# Patient Record
Sex: Male | Born: 1990 | Race: Black or African American | Hispanic: No | Marital: Single | Smoking: Never smoker
Health system: Southern US, Community
[De-identification: ages and names within clinical notes are randomized; demographics above are authoritative.]

---
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
## 2012-02-26 ENCOUNTER — Encounter (HOSPITAL_COMMUNITY): Payer: Self-pay | Admitting: *Deleted

## 2012-02-26 ENCOUNTER — Emergency Department (HOSPITAL_COMMUNITY): Payer: Self-pay

## 2012-02-26 ENCOUNTER — Emergency Department (HOSPITAL_COMMUNITY)
Admission: EM | Admit: 2012-02-26 | Discharge: 2012-02-26 | Disposition: A | Discharge status: Home / Self Care | Payer: Self-pay | Attending: Emergency Medicine | Admitting: Emergency Medicine

## 2012-02-26 DIAGNOSIS — S43006A Unspecified dislocation of unspecified shoulder joint, initial encounter: Secondary | ICD-10-CM | POA: Insufficient documentation

## 2012-02-26 DIAGNOSIS — X503XXA Overexertion from repetitive movements, initial encounter: Secondary | ICD-10-CM | POA: Insufficient documentation

## 2012-02-26 DIAGNOSIS — Y9371 Activity, boxing: Secondary | ICD-10-CM | POA: Insufficient documentation

## 2012-02-26 DIAGNOSIS — S43004A Unspecified dislocation of right shoulder joint, initial encounter: Secondary | ICD-10-CM

## 2012-02-26 DIAGNOSIS — Y9239 Other specified sports and athletic area as the place of occurrence of the external cause: Secondary | ICD-10-CM | POA: Insufficient documentation

## 2012-02-26 MED ORDER — MIDAZOLAM HCL 2 MG/2ML IJ SOLN
2.0000 mg | Freq: Once | INTRAMUSCULAR | Status: DC
  Filled 2012-02-26: qty 2

## 2012-02-26 MED ORDER — PROPOFOL 10 MG/ML IV BOLUS
INTRAVENOUS | Status: AC | PRN
  Administered 2012-02-26: 5 mg via INTRAVENOUS

## 2012-02-26 MED ORDER — TRAMADOL HCL 50 MG PO TABS: 50.0000 mg | ORAL_TABLET | Freq: Four times a day (QID) | ORAL | Status: DC | PRN

## 2012-02-26 MED ORDER — FENTANYL CITRATE 0.05 MG/ML IJ SOLN
100.0000 ug | Freq: Once | INTRAMUSCULAR | Status: AC
  Administered 2012-02-26: 100 ug via INTRAVENOUS
  Filled 2012-02-26: qty 2

## 2012-02-26 MED ORDER — PROPOFOL 10 MG/ML IV BOLUS: 1.0000 mg/kg | Freq: Once | INTRAVENOUS | Status: DC

## 2012-02-26 MED ORDER — PROPOFOL 10 MG/ML IV BOLUS
INTRAVENOUS | Status: AC
  Filled 2012-02-26: qty 20

## 2012-02-26 MED ORDER — MIDAZOLAM HCL 2 MG/2ML IJ SOLN
INTRAMUSCULAR | Status: AC | PRN
  Administered 2012-02-26: 2 mg via INTRAVENOUS

## 2012-02-26 MED ORDER — NAPROXEN 500 MG PO TABS: 500.0000 mg | ORAL_TABLET | Freq: Two times a day (BID) | ORAL | Status: DC

## 2012-02-26 NOTE — ED Notes (Signed)
Denies pain, nausea, numbness or tingling. CMS intact. Cap refill <2sec. arousable to voice, coughs on command, follows commands, MAEx4, resting with eyes closed.VSS/WNL.

## 2012-02-26 NOTE — ED Provider Notes (Signed)
History     CSN: [id number]  Arrival date & time 02/26/12  0116   None     Chief Complaint  Patient presents with  . Shoulder Pain    (Consider location/radiation/quality/duration/timing/severity/associated sxs/prior treatment) HPI Comments: 21 year old male with a history of right shoulder injury which occurred approximately 20 minutes prior to arrival. It was acute in onset, persistent, moderate to severe, worse with range of motion of the right shoulder and not associated with numbness tingling or weakness of the right hand. He states that he has never dislocated his right shoulder before, this happened while he was "boxing".  Patient is a 21 y.o. male presenting with shoulder pain. The history is provided by the patient.  Shoulder Pain    History reviewed. No pertinent past medical history.  History reviewed. No pertinent past surgical history.  No family history on file.  History  Substance Use Topics  . Smoking status: Never Smoker   . Smokeless tobacco: Not on file  . Alcohol Use: No      Review of Systems  Musculoskeletal: Positive for joint swelling.  Skin: Negative for rash.  Neurological: Negative for weakness and numbness.    Allergies  Review of patient's allergies indicates no known allergies.  Home Medications  No current outpatient prescriptions on file.  BP 118/81  Pulse 101  Temp 98.1 F (36.7 C) (Oral)  Resp 20  SpO2 100%  Physical Exam  Nursing note and vitals reviewed. Constitutional: He appears well-developed. No distress.       Uncomfortable appearing  HENT:       Oropharynx is clear and moist  Eyes: Conjunctivae normal are normal. No scleral icterus.  Neck: Normal range of motion. Neck supple.  Cardiovascular: Normal rate, regular rhythm, normal heart sounds and intact distal pulses.   Pulmonary/Chest: Effort normal and breath sounds normal.  Musculoskeletal: He exhibits tenderness. He exhibits no edema.       Right shoulder  deformity  Neurological: He is alert. Coordination normal.       Normal sensation and grip of the right upper extremity  Skin: Skin is warm and dry. No rash noted.    ED Course  Procedures (including critical care time)  Labs Reviewed - No data to display No results found.   No diagnosis found.    MDM  Clinically the patient appears to dislocated shoulder, I was unable to relocate the shoulder on arrival without sedating medicines, x-ray ordered, fentanyl given, procedural sedation discussed with patient, verbal and written consent obtained.   Procedure Note:      Dislocation Reduction  Date, Time: February 26 2012  2:15 AM  Indication: Dislocation of  the right shoulder(s) The patient has expressed understanding of the procedure being preformed Risks Benefits and alternatives of the procedure were give to the patient Written Consent obtained from patient Imaging results available showing:  Right anterior shoulder dislocation Site Marked : Yes  Time out called immediately prior to procedure Patient was placed in semifowler position Medicines used 2 mg Versed, 50 mg propofol, 100 mcg fentanyl Method used traction, countertraction Immobilization applied post reduction Post reduction radiographs successful reduction Patient tolerated procedure without any immediate complications. Approximate time of physician involvement in procedure 20 minutes Well tolerated by patient.   Post reduction films show interval reduction of the shoulder, the patient appears stable for discharge and he is neurovascularly intact after reduction.       Vida Roller, MD 02/26/12 [phone]

## 2012-02-26 NOTE — ED Notes (Signed)
Sleeping, awake, interactive, coughs on command, swallows water w/o difficulty, steward Scale of 6, VSS. Pending arrival of xray for post reduction films.

## 2012-02-26 NOTE — ED Notes (Signed)
Pt. Alert and oriented x4. 

## 2012-02-26 NOTE — ED Notes (Signed)
Pt was wrestling with a friend and feels like he dislocated R shoulder.  No numbness to R arm, but R shoulder appears deformed.

## 2012-02-26 NOTE — ED Notes (Signed)
To x-ray via stretcher.

## 2012-02-26 NOTE — ED Notes (Signed)
Vital signs stable. 

## 2012-02-26 NOTE — ED Notes (Signed)
Pt into room via w/c, EDP into room, xray here for pt. Pt alert, NAD, calm, interactive, guarding R shoulder, deformity obvious.

## 2012-02-26 NOTE — ED Notes (Signed)
R shoulder reduced by Dr. Hyacinth Meeker, 1st attempt using sheet for counter pressure, w/o difficulty or complications.

## 2012-02-26 NOTE — Progress Notes (Signed)
Orthopedic Tech Progress Note Patient Details:  Adam Salazar 03-02-1991 [id number]  Ortho Devices Type of Ortho Device: Sling immobilizer   Haskell Flirt 02/26/2012, 2:27 AM

## 2012-02-26 NOTE — ED Notes (Signed)
Patient denies pain and is resting comfortably.  

## 2012-02-26 NOTE — ED Notes (Signed)
Back from xray, no changes.  ?

## 2012-04-09 ENCOUNTER — Encounter (HOSPITAL_COMMUNITY): Payer: Self-pay | Admitting: *Deleted

## 2012-04-09 ENCOUNTER — Emergency Department (HOSPITAL_COMMUNITY)
Admission: EM | Admit: 2012-04-09 | Discharge: 2012-04-09 | Disposition: A | Discharge status: Home / Self Care | Payer: Self-pay | Attending: Emergency Medicine | Admitting: Emergency Medicine

## 2012-04-09 ENCOUNTER — Emergency Department (HOSPITAL_COMMUNITY): Payer: Self-pay

## 2012-04-09 DIAGNOSIS — X58XXXA Exposure to other specified factors, initial encounter: Secondary | ICD-10-CM | POA: Insufficient documentation

## 2012-04-09 DIAGNOSIS — Y9389 Activity, other specified: Secondary | ICD-10-CM | POA: Insufficient documentation

## 2012-04-09 DIAGNOSIS — IMO0002 Reserved for concepts with insufficient information to code with codable children: Secondary | ICD-10-CM | POA: Insufficient documentation

## 2012-04-09 DIAGNOSIS — Y929 Unspecified place or not applicable: Secondary | ICD-10-CM | POA: Insufficient documentation

## 2012-04-09 DIAGNOSIS — S43401A Unspecified sprain of right shoulder joint, initial encounter: Secondary | ICD-10-CM

## 2012-04-09 MED ORDER — IBUPROFEN 800 MG PO TABS: 800.0000 mg | ORAL_TABLET | Freq: Three times a day (TID) | ORAL | Status: DC

## 2012-04-09 NOTE — ED Provider Notes (Signed)
History     CSN: [id number]  Arrival date & time 04/09/12  2147   None     Chief Complaint  Patient presents with  . Shoulder Injury    (Consider location/radiation/quality/duration/timing/severity/associated sxs/prior treatment) HPI History provided by pt.   Pt w/ h/o right shoulder dislocation presents w/ R shoulder pain after his cousin landed on it while rough-housing on floor.  Aggravated by ROM and associated w/ R hand edema.  Denies neck pain and extremity paresthesias.   History reviewed. No pertinent past medical history.  History reviewed. No pertinent past surgical history.  History reviewed. No pertinent family history.  History  Substance Use Topics  . Smoking status: Never Smoker   . Smokeless tobacco: Not on file  . Alcohol Use: No      Review of Systems  All other systems reviewed and are negative.    Allergies  Review of patient's allergies indicates no known allergies.  Home Medications   Current Outpatient Rx  Name  Route  Sig  Dispense  Refill  . IBUPROFEN 800 MG PO TABS   Oral   Take 1 tablet (800 mg total) by mouth 3 (three) times daily.   12 tablet   0     BP 144/96  Pulse 71  Temp 98 F (36.7 C) (Oral)  Resp 20  SpO2 100%  Physical Exam  Nursing note and vitals reviewed. Constitutional: He is oriented to person, place, and time. He appears well-developed and well-nourished. No distress.  HENT:  Head: Normocephalic and atraumatic.  Eyes:       Normal appearance  Neck: Normal range of motion.  Pulmonary/Chest: Effort normal.  Musculoskeletal: Normal range of motion.       Cervical spine non-tender.  R shoulder w/out deformity or tenderness.  Pain w/ passive abduction and flexion >90deg.  Nml elbow and wrist.  No obvious R hand edema.  2+ radial pulse and distal sensation intact.      Neurological: He is alert and oriented to person, place, and time.  Psychiatric: He has a normal mood and affect. His behavior is normal.     ED Course  Procedures (including critical care time)  Labs Reviewed - No data to display Dg Shoulder Right  04/09/2012  *RADIOLOGY REPORT*  Clinical Data: Right shoulder pain post injury.  RIGHT SHOULDER - 2+ VIEW  Comparison: 02/26/2012  Findings: Bones, joint spaces and soft tissues are within normal. There is no fracture or dislocation.  IMPRESSION: No acute findings.   Original Report Authenticated By: Elberta Fortis, M.D.      1. Sprain of right shoulder       MDM  21yo M w/ h/o R shoulder dislocation presents w/ R shoulder injury.  No NV deficits on exam and xray neg for fx/dislocation.  Results discussed w/ pt.  Ortho tech placed in shoulder sling and pt d/c'd home w/ 800mg  ibuprofen.  Recommended icing and avoidance of aggravating activities. 10:37 PM        Otilio Miu, PA-C 04/09/12 2237

## 2012-04-09 NOTE — ED Notes (Signed)
Pt in stating his right shoulder is dislocated, pt in no distress and moving right arm without difficulty but c/o increased pain, states history of dislocation, pain has improved.

## 2012-04-09 NOTE — Progress Notes (Signed)
Orthopedic Tech Progress Note Patient Details:  Adam Salazar [birth year] [id number]  Ortho Devices Type of Ortho Device: Shoulder immobilizer Ortho Device/Splint Location: right UE Ortho Device/Splint Interventions: Application   Adam Salazar T 04/09/2012, 10:53 PM

## 2012-04-10 NOTE — ED Provider Notes (Signed)
Medical screening examination/treatment/procedure(s) were performed by non-physician practitioner and as supervising physician I was immediately available for consultation/collaboration.  Juliet Rude. Rubin Payor, MD 04/10/12 1478

## 2014-05-22 ENCOUNTER — Emergency Department (INDEPENDENT_AMBULATORY_CARE_PROVIDER_SITE_OTHER)
Admission: EM | Admit: 2014-05-22 | Discharge: 2014-05-22 | Disposition: A | Discharge status: Home / Self Care | Payer: Self-pay | Source: Home / Self Care | Attending: Family Medicine | Admitting: Family Medicine

## 2014-05-22 ENCOUNTER — Emergency Department (INDEPENDENT_AMBULATORY_CARE_PROVIDER_SITE_OTHER): Payer: Self-pay

## 2014-05-22 ENCOUNTER — Encounter (HOSPITAL_COMMUNITY): Payer: Self-pay | Admitting: *Deleted

## 2014-05-22 DIAGNOSIS — S4291XA Fracture of right shoulder girdle, part unspecified, initial encounter for closed fracture: Secondary | ICD-10-CM

## 2014-05-22 MED ORDER — IBUPROFEN 800 MG PO TABS: 800.0000 mg | ORAL_TABLET | Freq: Three times a day (TID) | ORAL | Status: DC

## 2014-05-22 MED ORDER — HYDROCODONE-ACETAMINOPHEN 5-325 MG PO TABS: 1.0000 | ORAL_TABLET | Freq: Four times a day (QID) | ORAL | Status: DC | PRN

## 2014-05-22 MED ORDER — OXYCODONE-ACETAMINOPHEN 5-325 MG PO TABS: 2.0000 | ORAL_TABLET | Freq: Four times a day (QID) | ORAL | Status: DC | PRN

## 2014-05-22 NOTE — Discharge Instructions (Signed)
Fracture A fracture is a break in a bone, due to a force on the bone that is greater than the bone's strength can handle. There are many types of fractures, including:  Complete fracture: The break passes completely through the bone.  Displaced: The ends of the bone fragments are not properly aligned.  Non-displaced: The ends of the bone fragments are in proper alignment.  Incomplete fracture (greenstick): The break does not pass completely through the bone. Incomplete fractures may or may not be angular (angulated).  Open fracture (compound): Part of the broken bone pokes through the skin. Open fractures have a high risk for infection.  Closed fracture: The fracture has not broken through the skin.  Comminuted fracture: The bone is broken into more than two pieces.  Compression fracture: The break occurs from extreme pressure on the bone (includes crushing injury).  Impacted fracture: The broken bone ends have been driven into each other.  Avulsion fracture: A ligament or tendon pulls a small piece of bone off from the main bony segment.  Pathologic fracture: A fracture due to the bone being made weak by a disease (osteoporosis or tumors).  Stress fracture: A fracture caused by intense exercise or repetitive and prolonged pressure that makes the bone weak. SYMPTOMS   Pain, tenderness, bleeding, bruising, and swelling at the fracture site.  Weakness and inability to bear weight on the injured extremity.  Paleness and deformity (sometimes).  Loss of pulse, numbness, tingling, or paralysis below the fracture site (usually a limb); these are emergencies. CAUSES  Bone being subjected to a force greater than its strength. RISK INCREASES WITH:  Contact sports and falls from heights.  Previous or current bone problems (osteoporosis or tumors).  Poor balance.  Poor strength and flexibility. PREVENTION   Warm up and stretch properly before activity.  Maintain physical  fitness:  Cardiovascular fitness.  Muscle strength.  Flexibility and endurance.  Wear proper protective equipment.  Use proper exercise technique. RELATED COMPLICATIONS   Bone fails to heal (nonunion).  Bone heals in a poor position (malunion).  Low blood volume (hypovolemic), shock due to blood loss.  Clump of fat cells travels through the blood (fat embolus) from the injury site to the lungs or brain (more common with thigh fractures).  Obstruction of nearby arteries. TREATMENT  Treatment first requires realigning of the bones (reduction) by a medically trained person, if the fracture is displaced. After realignment if the fracture is completed, or for non-displaced fractures, ice and medicine are used to reduce pain and inflammation. The bone and adjacent joints are then restrained with a splint, cast, or brace to allow the bones to heal without moving. Surgery is sometimes needed, to reposition the bones and hold the position with rods, pins, plates, or screws. Restraint for long periods of time may result in muscle and joint weakness or build up of fluid in tissues (edema). For this reason, physical therapy is often needed to regain strength and full range of motion. Recovery is complete when there is no bone motion at the fracture site and x-rays (radiographs) show complete healing.  MEDICATION   General anesthesia, sedation, or muscle relaxants may be needed to allow for realignment of the fracture. If pain medicine is needed, nonsteroidal anti-inflammatory medicines (aspirin and ibuprofen), or other minor pain relievers (acetaminophen), are often advised.  Do not take pain medicine for 7 days before surgery.  Stronger pain relievers may be prescribed by your caregiver. Use only as directed and only as much   as you need. SEEK MEDICAL CARE IF:   The following occur after restraint or surgery. (Report any of these signs immediately):  Swelling above or below the fracture  site.  Severe, persistent pain.  Blue or gray skin below the fracture site, especially under the nails. Numbness or loss of feeling below the fracture site. Document Released: 04/12/2005 Document Revised: 03/29/2012 Document Reviewed: 07/25/2008 [facility]ExitCare Patient Information 2015 [place], [place]. This information is not intended to replace advice given to you by your health care provider. Make sure you discuss any questions you have with your health care provider.  Shoulder Dislocation Your shoulder is made up of three bones: the collar bone (clavicle); the shoulder blade (scapula), which includes the socket (glenoid cavity); and the upper arm bone (humerus). Your shoulder joint is the place where these bones meet. Strong, fibrous tissues hold these bones together (ligaments). Muscles and strong, fibrous tissues that connect the muscles to these bones (tendons) allow your arm to move through this joint. The range of motion of your shoulder joint is more extensive than most of your other joints, and the glenoid cavity is very shallow. That is the reason that your shoulder joint is one of the most unstable joints in your body. It is far more prone to dislocation than your other joints. Shoulder dislocation is when your humerus is forced out of your shoulder joint. CAUSES Shoulder dislocation is caused by a forceful impact on your shoulder. This impact usually is from an injury, such as a sports injury or a fall. SYMPTOMS Symptoms of shoulder dislocation include:  Deformity of your shoulder.  Intense pain.  Inability to move your shoulder joint.  Numbness, weakness, or tingling around your shoulder joint (your neck or down your arm).  Bruising or swelling around your shoulder. DIAGNOSIS In order to diagnose a dislocated shoulder, your caregiver will perform a physical exam. Your caregiver also may have an X-ray exam done to see if you have any broken bones. Magnetic resonance imaging (MRI) is a  procedure that sometimes is done to help your caregiver see any damage to the soft tissues around your shoulder, particularly your rotator cuff tendons. Additionally, your caregiver also may have electromyography done to measure the electrical discharges produced in your muscles if you have signs or symptoms of nerve damage. TREATMENT A shoulder dislocation is treated by placing the humerus back in the joint (reduction). Your caregiver does this either manually (closed reduction), by moving your humerus back into the joint through manipulation, or through surgery (open reduction). When your humerus is back in place, severe pain should improve almost immediately. You also may need to have surgery if you have a weak shoulder joint or ligaments, and you have recurring shoulder dislocations, despite rehabilitation. In rare cases, surgery is necessary if your nerves or blood vessels are damaged during the dislocation. After your reduction, your arm will be placed in a shoulder immobilizer or sling to keep it from moving. Your caregiver will have you wear your shoulder immobilizer or sling for 3 days to 3 weeks, depending on how serious your dislocation is. When your shoulder immobilizer or sling is removed, your caregiver may prescribe physical therapy to help improve the range of motion in your shoulder joint. HOME CARE INSTRUCTIONS  The following measures can help to reduce pain and speed up the healing process:  Rest your injured joint. Do not move it. Avoid activities similar to the one that caused your injury.  Apply ice to your injured joint  for the first day or two after your reduction or as directed by your caregiver. Applying ice helps to reduce inflammation and pain.  Put ice in a plastic bag.  Place a towel between your skin and the bag.  Leave the ice on for 15-20 minutes at a time, every 2 hours while you are awake.  Exercise your hand by squeezing a soft ball. This helps to eliminate  stiffness and swelling in your hand and wrist.  Take over-the-counter or prescription medicine for pain or discomfort as told by your caregiver. SEEK IMMEDIATE MEDICAL CARE IF:   Your shoulder immobilizer or sling becomes damaged.  Your pain becomes worse rather than better.  You lose feeling in your arm or hand, or they become white and cold. MAKE SURE YOU:   Understand these instructions.  Will watch your condition.  Will get help right away if you are not doing well or get worse. Document Released: 01/05/2001 Document Revised: 08/27/2013 Document Reviewed: 01/31/2011 [facility] Patient Information 2015 [place], [place]. This information is not intended to replace advice given to you by your health care provider. Make sure you discuss any questions you have with your health care provider.  Shoulder Fracture You have a fractured humerus (bone in the upper arm) at the shoulder just below the ball of the shoulder joint. Most of the time the bones of a broken shoulder are in an acceptable position. Usually the injury can be treated with a shoulder immobilizer or sling and swath bandage. These devices support the arm and prevent any shoulder movement. If the bones are not in a good position, then surgery is sometimes needed. Shoulder fractures usually cause swelling, pain, and discoloration around the upper arm initially. They heal in 8-12 weeks with proper treatment. Rest in bed or a reclining chair as long as your shoulder is very painful. Sitting up generally results in less pain at the fracture site. Do not remove your shoulder bandage until your caregiver approves. You may apply ice packs over the shoulder for 20-30 minutes every 2 hours for the next 2-3 days to reduce the pain and swelling. Use your pain medicine as prescribed.  SEEK IMMEDIATE MEDICAL CARE IF:  You develop severe shoulder pain unrelieved by rest and taking pain medicine.  You have pain, numbness, tingling, or weakness in  the hand or wrist.  You develop shortness of breath, chest pain, severe weakness, or fainting.  You have severe pain with motion of the fingers or wrist. MAKE SURE YOU:   Understand these instructions.  Will watch your condition.  Will get help right away if you are not doing well or get worse. Document Released: 05/20/2004 Document Revised: 07/05/2011 Document Reviewed: 07/31/2008 [facility] Patient Information 2015 [place], [place]. This information is not intended to replace advice given to you by your health care provider. Make sure you discuss any questions you have with your health care provider.

## 2014-05-22 NOTE — ED Provider Notes (Signed)
CSN: [id number]     Arrival date & time 05/22/14  1201 History   First MD Initiated Contact with Patient 05/22/14 1231     Chief Complaint  Patient presents with  . Shoulder Injury   (Consider location/radiation/quality/duration/timing/severity/associated sxs/prior Treatment) HPI         24 year old male presents for evaluation of shoulder dislocation. He was playing with his cousin when he fell earlier today. He thinks he dislocated his shoulder and then it went back into place spontaneously. He has pain and soreness all over the shoulder from the injury. No numbness distal to the shoulder. He has a history of 4 previous shoulder dislocations, most recently one year ago.   History reviewed. No pertinent past medical history. History reviewed. No pertinent past surgical history. History reviewed. No pertinent family history. History  Substance Use Topics  . Smoking status: Never Smoker   . Smokeless tobacco: Not on file  . Alcohol Use: No    Review of Systems  Musculoskeletal: Positive for arthralgias.  Neurological: Negative for weakness and numbness.  All other systems reviewed and are negative.   Allergies  Review of patient's allergies indicates no known allergies.  Home Medications   Prior to Admission medications   Medication Sig Start Date End Date Taking? Authorizing Provider  HYDROcodone-acetaminophen (NORCO) 5-325 MG per tablet Take 1 tablet by mouth every 6 (six) hours as needed for moderate pain. 05/22/14   Graylon GoodZachary H Agron Swiney, PA-C  ibuprofen (ADVIL,MOTRIN) 800 MG tablet Take 1 tablet (800 mg total) by mouth 3 (three) times daily. 04/09/12   Arie Sabinaatherine E Schinlever, PA-C  ibuprofen (ADVIL,MOTRIN) 800 MG tablet Take 1 tablet (800 mg total) by mouth 3 (three) times daily. 05/22/14   Adrian BlackwaterZachary H Santiel Topper, PA-C   BP 124/74 mmHg  Pulse 78  Temp(Src) 98.6 F (37 C) (Oral)  Resp 18 Physical Exam  Constitutional: He is oriented to person, place, and time. He appears  well-developed and well-nourished. No distress.  HENT:  Head: Normocephalic.  Cardiovascular:  Pulses:      Radial pulses are 2+ on the right side.  Pulmonary/Chest: Effort normal. No respiratory distress.  Musculoskeletal:       Right shoulder: He exhibits tenderness (diffuse about the shoulder\) and pain. He exhibits normal range of motion, no bony tenderness, no swelling, no effusion, no crepitus, no deformity, no spasm and normal strength.  Neurological: He is alert and oriented to person, place, and time. No sensory deficit. Coordination normal.  Skin: Skin is warm and dry. No rash noted. He is not diaphoretic.  Psychiatric: He has a normal mood and affect. Judgment normal.  Nursing note and vitals reviewed.   ED Course  Procedures (including critical care time) Labs Review Labs Reviewed - No data to display  Imaging Review Dg Shoulder Right  05/22/2014   CLINICAL DATA:  Right shoulder dislocation about an hr ago, patient reduced it himself. Multiple prior dislocations.  EXAM: RIGHT SHOULDER - 2+ VIEW  COMPARISON:  04/09/2012  FINDINGS: There is a bone fragment at the inferior aspect of the glenohumeral joint measuring approximately 2 x 5 mm. This was not evident on the 04/09/2012 examination. It may represent a loose fragment from [place] fracture. There is no dislocation at this time.  IMPRESSION: Small bone fragment inferiorly may represent a loose [place] fracture fragment. Chronicity is indeterminate but this was not evident on 04/09/2012.  No dislocation at this time.   Electronically Signed   By: Rosey Bathaniel R Mitchell M.D.  On: 05/22/2014 13:55     MDM   1. Shoulder fracture, right, closed, initial encounter    Placed in a sling immobilizer, ibuprofen and Norco for pain. I discussed with on-call orthopedist, they will see this patient next week in follow-up in clinic   Meds ordered this encounter  Medications  . HYDROcodone-acetaminophen (NORCO) 5-325 MG per tablet     Sig: Take 1 tablet by mouth every 6 (six) hours as needed for moderate pain.    Dispense:  20 tablet    Refill:  0    Order Specific Question:  Supervising Provider    Answer:  Clementeen Graham, S [id number]  . ibuprofen (ADVIL,MOTRIN) 800 MG tablet    Sig: Take 1 tablet (800 mg total) by mouth 3 (three) times daily.    Dispense:  60 tablet    Refill:  0    Order Specific Question:  Supervising Provider    Answer:  Clementeen Graham, Kathie Rhodes [[id number]]       Graylon Good, PA-C 05/22/14 1445

## 2014-05-22 NOTE — ED Notes (Signed)
Pt  Reports   He  Was  horseplaying      Today  And  His  Shoulder       May  Have  Popped  Out  And  He  Thinks  It is  Now back  In          he  Has  Had  A  History  Of  Similar    Episode  In past

## 2014-09-23 ENCOUNTER — Emergency Department (HOSPITAL_COMMUNITY): Payer: Self-pay

## 2014-09-23 ENCOUNTER — Encounter (HOSPITAL_COMMUNITY): Payer: Self-pay | Admitting: *Deleted

## 2014-09-23 ENCOUNTER — Emergency Department (HOSPITAL_COMMUNITY)
Admission: EM | Admit: 2014-09-23 | Discharge: 2014-09-23 | Disposition: A | Discharge status: Home / Self Care | Payer: Self-pay | Attending: Emergency Medicine | Admitting: Emergency Medicine

## 2014-09-23 DIAGNOSIS — Y998 Other external cause status: Secondary | ICD-10-CM | POA: Insufficient documentation

## 2014-09-23 DIAGNOSIS — X58XXXA Exposure to other specified factors, initial encounter: Secondary | ICD-10-CM | POA: Insufficient documentation

## 2014-09-23 DIAGNOSIS — Y9389 Activity, other specified: Secondary | ICD-10-CM | POA: Insufficient documentation

## 2014-09-23 DIAGNOSIS — Y9289 Other specified places as the place of occurrence of the external cause: Secondary | ICD-10-CM | POA: Insufficient documentation

## 2014-09-23 DIAGNOSIS — S43004A Unspecified dislocation of right shoulder joint, initial encounter: Secondary | ICD-10-CM | POA: Insufficient documentation

## 2014-09-23 DIAGNOSIS — Z791 Long term (current) use of non-steroidal anti-inflammatories (NSAID): Secondary | ICD-10-CM | POA: Insufficient documentation

## 2014-09-23 MED ORDER — TRAMADOL HCL 50 MG PO TABS: 50.0000 mg | ORAL_TABLET | Freq: Four times a day (QID) | ORAL | Status: DC | PRN

## 2014-09-23 MED ORDER — LIDOCAINE-EPINEPHRINE (PF) 2 %-1:200000 IJ SOLN
10.0000 mL | Freq: Once | INTRAMUSCULAR | Status: AC
  Administered 2014-09-23: 10 mL

## 2014-09-23 NOTE — ED Notes (Signed)
Pt connected to continuous BP, pulse ox and cardiac monitor. Family at bedside

## 2014-09-23 NOTE — ED Notes (Signed)
Pt in stating he thinks he dislocated his right shoulder, history of same in the past, limited movement to right arm, no obvious deformity noted, CMS intact

## 2014-09-23 NOTE — ED Notes (Signed)
X-ray at bedside

## 2014-09-23 NOTE — ED Notes (Signed)
EDP and resident at bedside attempting to reduce R shoulder. Pt tolerating without difficulty. Vital signs stable. Family at bedside.

## 2014-09-23 NOTE — Discharge Instructions (Signed)
Shoulder Dislocation ° Shoulder dislocation is when your upper arm bone (humerus) is forced out of your shoulder joint. Your doctor will put your shoulder back into the joint by pulling on your arm or through surgery. Your arm will be placed in a shoulder immobilizer or sling. The shoulder immobilizer or sling holds your shoulder in place while it heals. °HOME CARE  °· Rest your injured joint. Do not move it until instructed to do so. °· Put ice on your injured joint as told by your doctor. °¨ Put ice in a plastic bag. °¨ Place a towel between your skin and the bag. °¨ Leave the ice on for 15-20 minutes at a time, every 2 hours while you are awake. °· Only take medicines as told by your doctor. °· Squeeze a ball to exercise your hand. °GET HELP RIGHT AWAY IF:  °· Your splint or sling becomes damaged. °· Your pain becomes worse, not better. °· You lose feeling in your arm or hand. °· Your arm or hand becomes white or cold. °MAKE SURE YOU:  °· Understand these instructions. °· Will watch your condition. °· Will get help right away if you are not doing well or get worse. °Document Released: 07/05/2011 Document Reviewed: 07/05/2011 °ExitCare® Patient Information ©2015 [employer]. This information is not intended to replace advice given to you by your health care provider. Make sure you discuss any questions you have with your health care provider. ° °

## 2014-09-23 NOTE — ED Provider Notes (Signed)
CSN: [id number]     Arrival date & time 09/23/14  1248 History   First MD Initiated Contact with Patient 09/23/14 1319     Chief Complaint  Patient presents with  . Shoulder Injury     (Consider location/radiation/quality/duration/timing/severity/associated sxs/prior Treatment) Patient is a 24 y.o. male presenting with shoulder injury. The history is provided by the patient.  Shoulder Injury This is a new (was playing around and moved his arm a certain way and shoulder came out) problem. The current episode started 1 to 2 hours ago. The problem occurs constantly. The problem has not changed since onset.Associated symptoms comments: Right shoulder pain. The symptoms are aggravated by bending and twisting. Nothing relieves the symptoms. He has tried nothing for the symptoms. The treatment provided no relief.    History reviewed. No pertinent past medical history. History reviewed. No pertinent past surgical history. History reviewed. No pertinent family history. History  Substance Use Topics  . Smoking status: Never Smoker   . Smokeless tobacco: Not on file  . Alcohol Use: No    Review of Systems  All other systems reviewed and are negative.     Allergies  Vicodin  Home Medications   Prior to Admission medications   Medication Sig Start Date End Date Taking? Authorizing Provider  ibuprofen (ADVIL,MOTRIN) 800 MG tablet Take 1 tablet (800 mg total) by mouth 3 (three) times daily. 04/09/12   Catherine Schinlever, PA-C  ibuprofen (ADVIL,MOTRIN) 800 MG tablet Take 1 tablet (800 mg total) by mouth 3 (three) times daily. 05/22/14   Graylon Good, PA-C  oxyCODONE-acetaminophen (PERCOCET/ROXICET) 5-325 MG per tablet Take 2 tablets by mouth every 6 (six) hours as needed for severe pain. 05/22/14   Adrian Blackwater Baker, PA-C   BP 140/92 mmHg  Pulse 75  Temp(Src) 98.1 F (36.7 C) (Oral)  Resp 20  SpO2 99% Physical Exam  Constitutional: He is oriented to person, place, and time. He  appears well-developed and well-nourished. No distress.  HENT:  Head: Normocephalic and atraumatic.  Eyes: EOM are normal. Pupils are equal, round, and reactive to light.  Cardiovascular: Normal rate.   Pulmonary/Chest: Effort normal.  Musculoskeletal:       Left shoulder: He exhibits decreased range of motion, bony tenderness, deformity and pain. He exhibits normal pulse and normal strength.  Neurological: He is alert and oriented to person, place, and time.  Skin: Skin is warm and dry.  Psychiatric: He has a normal mood and affect. His behavior is normal.  Nursing note and vitals reviewed.   ED Course  Procedures (including critical care time) Labs Review Labs Reviewed - No data to display  Imaging Review Dg Shoulder Right  09/23/2014   CLINICAL DATA:  Recent shoulder injury with pain, initial encounter  EXAM: RIGHT SHOULDER - 2+ VIEW  COMPARISON:  None.  FINDINGS: There is an anterior inferior dislocation of the humeral head with respect to the glenoid labrum. A small bony fragment is noted consistent with an fracture from the glenoid. No other focal abnormality is noted.  IMPRESSION: Anterior inferior dislocation of the humeral head with an associated fracture from the glenoid.   Electronically Signed   By: Alcide Clever M.D.   On: 09/23/2014 13:36   Dg Shoulder Right Port  09/23/2014   CLINICAL DATA:  Right shoulder dislocation today post reduction.  EXAM: PORTABLE RIGHT SHOULDER - 2+ VIEW  COMPARISON:  09/23/2014 and 05/22/2014.  FINDINGS: There has been reduction of patient's anterior shoulder dislocation with normal  alignment about the glenohumeral joint. There is a crescentic fragment adjacent the anterior inferior glenoid as a similar finding was present on the previous exam from January 2016, although this fragment appear to be displaced on the exam from earlier today. This may be evidence of acute or chronic injury. AC joint is unremarkable.  IMPRESSION: Normal alignment post  anterior shoulder dislocation. Small fragment adjacent the anterior inferior glenoid which may be acute or chronic.   Electronically Signed   By: Elberta Fortisaniel  Boyle M.D.   On: 09/23/2014 14:45     EKG Interpretation None       Reduction of dislocation Date/Time: 2:32 PM Performed by: Gwyneth SproutPLUNKETT,Rhythm Wigfall Authorized by: Gwyneth SproutPLUNKETT,Rolande Moe Consent: Verbal consent obtained. Risks and benefits: risks, benefits and alternatives were discussed Consent given by: patient Required items: required blood products, implants, devices, and special equipment available Time out: Immediately prior to procedure a "time out" was called to verify the correct patient, procedure, equipment, support staff and site/side marked as required.  Patient sedated: none.  Right shoulder joint injected with lidocaine 2% with Epi.  Vitals: Vital signs were monitored during sedation. Patient tolerance: Patient tolerated the procedure well with no immediate complications. Joint: right shoulder  Reduction technique: shoulder abduction and extension with traction    MDM   Final diagnoses:  Shoulder dislocation, right, initial encounter    Patient with prior history of shoulder dislocation who presents today with right shoulder pain and dislocation when he was playing around and it came out.  No numbness or tingling in the hands. Imaging is consistent with an and anterior inferior dislocation of the humeral head with fracture of the glenoid. Patient will need orthopedic follow-up for this. We'll attempt to reduce by a joint injection.  2:31 PM Shoulder reduced after joint injection.  Repeat imaging showed reduced shoulder.  Pt d/ced home in immobilizer and encouraged to f/u with ortho.  Gwyneth SproutWhitney Von Inscoe, MD 09/23/14 [phone]

## 2014-12-15 ENCOUNTER — Emergency Department (HOSPITAL_COMMUNITY)
Admission: EM | Admit: 2014-12-15 | Discharge: 2014-12-15 | Disposition: A | Discharge status: Home / Self Care | Payer: Self-pay | Attending: Emergency Medicine | Admitting: Emergency Medicine

## 2014-12-15 ENCOUNTER — Encounter (HOSPITAL_COMMUNITY): Payer: Self-pay | Admitting: *Deleted

## 2014-12-15 DIAGNOSIS — R369 Urethral discharge, unspecified: Secondary | ICD-10-CM | POA: Insufficient documentation

## 2014-12-15 DIAGNOSIS — R300 Dysuria: Secondary | ICD-10-CM | POA: Insufficient documentation

## 2014-12-15 MED ORDER — AZITHROMYCIN 250 MG PO TABS
1000.0000 mg | ORAL_TABLET | Freq: Once | ORAL | Status: AC
  Administered 2014-12-15: 1000 mg via ORAL
  Filled 2014-12-15: qty 4

## 2014-12-15 MED ORDER — DOXYCYCLINE HYCLATE 100 MG PO CAPS: 100.0000 mg | ORAL_CAPSULE | Freq: Two times a day (BID) | ORAL | Status: DC

## 2014-12-15 MED ORDER — LIDOCAINE HCL (PF) 1 % IJ SOLN
INTRAMUSCULAR | Status: AC
  Administered 2014-12-15: 5 mL
  Filled 2014-12-15: qty 5

## 2014-12-15 MED ORDER — CEFTRIAXONE SODIUM 250 MG IJ SOLR
250.0000 mg | Freq: Once | INTRAMUSCULAR | Status: AC
  Administered 2014-12-15: 250 mg via INTRAMUSCULAR
  Filled 2014-12-15: qty 250

## 2014-12-15 NOTE — ED Notes (Signed)
Pt reports noticing penile discharge today.  Denies any pain at this time.  Pt denies any urinary sxs at this time.

## 2014-12-15 NOTE — Discharge Instructions (Signed)
Sexually Transmitted Disease °A sexually transmitted disease (STD) is a disease or infection that may be passed (transmitted) from person to person, usually during sexual activity. This may happen by way of saliva, semen, blood, vaginal mucus, or urine. Common STDs include:  °· Gonorrhea.   °· Chlamydia.   °· Syphilis.   °· HIV and AIDS.   °· Genital herpes.   °· Hepatitis B and C.   °· Trichomonas.   °· Human papillomavirus (HPV).   °· Pubic lice.   °· Scabies. °· Mites. °· Bacterial vaginosis. °WHAT ARE CAUSES OF STDs? °An STD may be caused by bacteria, a virus, or parasites. STDs are often transmitted during sexual activity if one person is infected. However, they may also be transmitted through nonsexual means. STDs may be transmitted after:  °· Sexual intercourse with an infected person.   °· Sharing sex toys with an infected person.   °· Sharing needles with an infected person or using unclean piercing or tattoo needles. °· Having intimate contact with the genitals, mouth, or rectal areas of an infected person.   °· Exposure to infected fluids during birth. °WHAT ARE THE SIGNS AND SYMPTOMS OF STDs? °Different STDs have different symptoms. Some people may not have any symptoms. If symptoms are present, they may include:  °· Painful or bloody urination.   °· Pain in the pelvis, abdomen, vagina, anus, throat, or eyes.   °· A skin rash, itching, or irritation. °· Growths, ulcerations, blisters, or sores in the genital and anal areas. °· Abnormal vaginal discharge with or without bad odor.   °· Penile discharge in men.   °· Fever.   °· Pain or bleeding during sexual intercourse.   °· Swollen glands in the groin area.   °· Yellow skin and eyes (jaundice). This is seen with hepatitis.   °· Swollen testicles. °· Infertility. °· Sores and blisters in the mouth. °HOW ARE STDs DIAGNOSED? °To make a diagnosis, your health care provider may:  °· Take a medical history.   °· Perform a physical exam.   °· Take a sample of  any discharge to examine. °· Swab the throat, cervix, opening to the penis, rectum, or vagina for testing. °· Test a sample of your first morning urine.   °· Perform blood tests.   °· Perform a Pap test, if this applies.   °· Perform a colposcopy.   °· Perform a laparoscopy.   °HOW ARE STDs TREATED? ° Treatment depends on the STD. Some STDs may be treated but not cured.  °· Chlamydia, gonorrhea, trichomonas, and syphilis can be cured with antibiotic medicine.   °· Genital herpes, hepatitis, and HIV can be treated, but not cured, with prescribed medicines. The medicines lessen symptoms.   °· Genital warts from HPV can be treated with medicine or by freezing, burning (electrocautery), or surgery. Warts may come back.   °· HPV cannot be cured with medicine or surgery. However, abnormal areas may be removed from the cervix, vagina, or vulva.   °· If your diagnosis is confirmed, your recent sexual partners need treatment. This is true even if they are symptom-free or have a negative culture or evaluation. They should not have sex until their health care providers say it is okay. °HOW CAN I REDUCE MY RISK OF GETTING AN STD? °Take these steps to reduce your risk of getting an STD: °· Use latex condoms, dental dams, and water-soluble lubricants during sexual activity. Do not use petroleum jelly or oils. °· Avoid having multiple sex partners. °· Do not have sex with someone who has other sex partners. °· Do not have sex with anyone you do not know or who is at   high risk for an STD. °· Avoid risky sex practices that can break your skin. °· Do not have sex if you have open sores on your mouth or skin. °· Avoid drinking too much alcohol or taking illegal drugs. Alcohol and drugs can affect your judgment and put you in a vulnerable position. °· Avoid engaging in oral and anal sex acts. °· Get vaccinated for HPV and hepatitis. If you have not received these vaccines in the past, talk to your health care provider about whether one  or both might be right for you.   °· If you are at risk of being infected with HIV, it is recommended that you take a prescription medicine daily to prevent HIV infection. This is called pre-exposure prophylaxis (PrEP). You are considered at risk if: °¨ You are a man who has sex with other men (MSM). °¨ You are a heterosexual man or woman and are sexually active with more than one partner. °¨ You take drugs by injection. °¨ You are sexually active with a partner who has HIV. °· Talk with your health care provider about whether you are at high risk of being infected with HIV. If you choose to begin PrEP, you should first be tested for HIV. You should then be tested every 3 months for as long as you are taking PrEP.   °WHAT SHOULD I DO IF I THINK I HAVE AN STD? °· See your health care provider.   °· Tell your sexual partner(s). They should be tested and treated for any STDs. °· Do not have sex until your health care provider says it is okay.  °WHEN SHOULD I GET IMMEDIATE MEDICAL CARE? °Contact your health care provider right away if:  °· You have severe abdominal pain. °· You are a man and notice swelling or pain in your testicles. °· You are a woman and notice swelling or pain in your vagina. °Document Released: 07/03/2002 Document Revised: 04/17/2013 Document Reviewed: 10/31/2012 °ExitCare® Patient Information ©2015 [employer]. This information is not intended to replace advice given to you by your health care provider. Make sure you discuss any questions you have with your health care provider. ° °Safe Sex °Safe sex is about reducing the risk of giving or getting a sexually transmitted disease (STD). STDs are spread through sexual contact involving the genitals, mouth, or rectum. Some STDs can be cured and others cannot. Safe sex can also prevent unintended pregnancies.  °WHAT ARE SOME SAFE SEX PRACTICES? °· Limit your sexual activity to only one partner who is having sex with only you. °· Talk to your partner  about his or her past partners, past STDs, and drug use. °· Use a condom every time you have sexual intercourse. This includes vaginal, oral, and anal sexual activity. Both females and males should wear condoms during oral sex. Only use latex or polyurethane condoms and water-based lubricants. Using petroleum-based lubricants or oils to lubricate a condom will weaken the condom and increase the chance that it will break. The condom should be in place from the beginning to the end of sexual activity. Wearing a condom reduces, but does not completely eliminate, your risk of getting or giving an STD. STDs can be spread by contact with infected body fluids and skin. °· Get vaccinated for hepatitis B and HPV. °· Avoid alcohol and recreational drugs, which can affect your judgment. You may forget to use a condom or participate in high-risk sex. °· For females, avoid douching after sexual intercourse. Douching can spread an infection   farther into the reproductive tract. °· Check your body for signs of sores, blisters, rashes, or unusual discharge. See your health care provider if you notice any of these signs. °· Avoid sexual contact if you have symptoms of an infection or are being treated for an STD. If you or your partner has herpes, avoid sexual contact when blisters are present. Use condoms at all other times. °· If you are at risk of being infected with HIV, it is recommended that you take a prescription medicine daily to prevent HIV infection. This is called pre-exposure prophylaxis (PrEP). You are considered at risk if: °¨ You are a man who has sex with other men (MSM). °¨ You are a heterosexual man or woman who is sexually active with more than one partner. °¨ You take drugs by injection. °¨ You are sexually active with a partner who has HIV. °· Talk with your health care provider about whether you are at high risk of being infected with HIV. If you choose to begin PrEP, you should first be tested for HIV. You  should then be tested every 3 months for as long as you are taking PrEP. °· See your health care provider for regular screenings, exams, and tests for other STDs. Before having sex with a new partner, each of you should be screened for STDs and should talk about the results with each other. °WHAT ARE THE BENEFITS OF SAFE SEX?  °· There is less chance of getting or giving an STD. °· You can prevent unwanted or unintended pregnancies. °· By discussing safe sex concerns with your partner, you may increase feelings of intimacy, comfort, trust, and honesty between the two of you. °Document Released: 05/20/2004 Document Revised: 08/27/2013 Document Reviewed: 10/04/2011 °ExitCare® Patient Information ©2015 [employer]. This information is not intended to replace advice given to you by your health care provider. Make sure you discuss any questions you have with your health care provider. ° °

## 2014-12-15 NOTE — ED Provider Notes (Signed)
CSN: [id number]     Arrival date & time 12/15/14  2021 History  This chart was scribed for non-physician practitioner, Tommy Rainwater, working with Derwood Kaplan, MD by Freida Busman, ED Scribe. This patient was seen in room WTR6/WTR6 and the patient's care was started at 9:42 PM.    Chief Complaint  Patient presents with  . Penile Discharge    The history is provided by the patient. No language interpreter was used.    HPI Comments:  Adam Salazar is a 25 y.o. male who presents to the Emergency Department complaining of penile discharge for one day. He denies penile pain and urinary symptoms. No alleviating factors noted.  Allergy: Vicodin History reviewed. No pertinent past medical history. History reviewed. No pertinent past surgical history. No family history on file. Social History  Substance Use Topics  . Smoking status: Never Smoker   . Smokeless tobacco: None  . Alcohol Use: No    Review of Systems  Constitutional: Negative for fever.  Gastrointestinal: Negative for nausea, vomiting and abdominal pain.  Genitourinary: Positive for dysuria and discharge. Negative for difficulty urinating and penile pain.  Musculoskeletal: Negative for myalgias.  Skin: Negative for wound.      Allergies  Vicodin  Home Medications   Prior to Admission medications   Medication Sig Start Date End Date Taking? Authorizing Provider  Chlorpheniramine Maleate (ALLERGY PO) Take 2 tablets by mouth once.   Yes Historical Provider, MD  ibuprofen (ADVIL,MOTRIN) 800 MG tablet Take 1 tablet (800 mg total) by mouth 3 (three) times daily. Patient not taking: Reported on 09/23/2014 04/09/12   Ruby Cola, PA-C  ibuprofen (ADVIL,MOTRIN) 800 MG tablet Take 1 tablet (800 mg total) by mouth 3 (three) times daily. Patient not taking: Reported on 09/23/2014 05/22/14   Graylon Good, PA-C  traMADol (ULTRAM) 50 MG tablet Take 1 tablet (50 mg total) by mouth every 6 (six) hours as  needed. Patient not taking: Reported on 12/15/2014 09/23/14   Gwyneth Sprout, MD   BP 142/85 mmHg  Pulse 91  Temp(Src) 98.2 F (36.8 C) (Oral)  Resp 18  Ht  (1.727 m)  Wt 200 lb (90.719 kg)  BMI 30.42 kg/m2  SpO2 100% Physical Exam  Constitutional: He appears well-developed and well-nourished. No distress.  HENT:  Head: Normocephalic and atraumatic.  Eyes: Conjunctivae are normal.  Neck: Normal range of motion.  Cardiovascular: Normal rate.   Pulmonary/Chest: Effort normal.  Abdominal: He exhibits no distension. There is no tenderness.  Genitourinary:  Circumcised penis with thin, yellowish brown discharge. No testicular tenderness or scrotal swelling.   Musculoskeletal: Normal range of motion.  Lymphadenopathy:       Right: No inguinal adenopathy present.       Left: No inguinal adenopathy present.  Neurological: He is alert.  Skin: Skin is warm and dry.  Nursing note and vitals reviewed.   ED Course  Procedures   DIAGNOSTIC STUDIES:  Oxygen Saturation is 100% on RA, normal by my interpretation.    COORDINATION OF CARE:  9:45 PM Discussed treatment plan with pt at bedside and pt agreed to plan.  Labs Review Labs Reviewed  RPR  HIV ANTIBODY (ROUTINE TESTING)  GC/CHLAMYDIA PROBE AMP ([place]) NOT AT [facility]    Imaging Review No results found. I have personally reviewed and evaluated these images and lab results as part of my medical decision-making.   EKG Interpretation None      MDM   Final diagnoses:  None  1. Penile discharge  Tests pending for HIV, RPR, GC/Chlamydia. Treated with Rocephin and Zithromax, Doxycycline for 5 days.   I personally performed the services described in this documentation, which was scribed in my presence. The recorded information has been reviewed and is accurate.     Elpidio Anis, PA-C 12/15/14 2250  Derwood Kaplan, MD 12/16/14 [phone]

## 2014-12-16 LAB — RPR: RPR Ser Ql: NONREACTIVE

## 2014-12-16 LAB — HIV ANTIBODY (ROUTINE TESTING W REFLEX): HIV SCREEN 4TH GENERATION: NONREACTIVE

## 2014-12-16 LAB — GC/CHLAMYDIA PROBE AMP (~~LOC~~) NOT AT ARMC
CHLAMYDIA, DNA PROBE: NEGATIVE
NEISSERIA GONORRHEA: POSITIVE — AB

## 2014-12-18 ENCOUNTER — Telehealth (HOSPITAL_BASED_OUTPATIENT_CLINIC_OR_DEPARTMENT_OTHER): Payer: Self-pay | Admitting: Emergency Medicine

## 2014-12-21 ENCOUNTER — Telehealth (HOSPITAL_COMMUNITY): Payer: Self-pay | Admitting: Emergency Medicine

## 2014-12-21 NOTE — Telephone Encounter (Signed)
Post ED Visit - Positive Culture Follow-up: Unsuccessful Patient Follow-up  Positive Gonorrhea culture   Unable to contact patient after 3 attempts, letter will be sent to address on file  Adam Salazar 12/21/2014, 12:53 PM

## 2014-12-31 ENCOUNTER — Telehealth (HOSPITAL_BASED_OUTPATIENT_CLINIC_OR_DEPARTMENT_OTHER): Payer: Self-pay | Admitting: Emergency Medicine

## 2014-12-31 NOTE — Telephone Encounter (Signed)
Letter returned , unable to forward , no known forwarding address, lost to followup

## 2015-01-02 ENCOUNTER — Telehealth (HOSPITAL_COMMUNITY): Payer: Self-pay

## 2015-01-02 NOTE — Telephone Encounter (Signed)
Unable to contact pt by mail or telephone. Unable to communicate lab results or treatment changes. 

## 2015-03-10 ENCOUNTER — Emergency Department (HOSPITAL_COMMUNITY)
Admission: EM | Admit: 2015-03-10 | Discharge: 2015-03-11 | Disposition: A | Discharge status: Home / Self Care | Payer: Self-pay | Attending: Emergency Medicine | Admitting: Emergency Medicine

## 2015-03-10 ENCOUNTER — Encounter (HOSPITAL_COMMUNITY): Payer: Self-pay | Admitting: *Deleted

## 2015-03-10 DIAGNOSIS — R319 Hematuria, unspecified: Secondary | ICD-10-CM

## 2015-03-10 DIAGNOSIS — N342 Other urethritis: Secondary | ICD-10-CM

## 2015-03-10 DIAGNOSIS — N341 Nonspecific urethritis: Secondary | ICD-10-CM | POA: Insufficient documentation

## 2015-03-10 LAB — URINALYSIS, ROUTINE W REFLEX MICROSCOPIC
BILIRUBIN URINE: NEGATIVE
Glucose, UA: NEGATIVE mg/dL
HGB URINE DIPSTICK: NEGATIVE
KETONES UR: NEGATIVE mg/dL
NITRITE: NEGATIVE
PH: 7.5 (ref 5.0–8.0)
Protein, ur: NEGATIVE mg/dL
SPECIFIC GRAVITY, URINE: 1.027 (ref 1.005–1.030)
UROBILINOGEN UA: 1 mg/dL (ref 0.0–1.0)

## 2015-03-10 NOTE — ED Notes (Signed)
Patient is alert and oriented x4.  He is complaining of hematuria that started this evening.  Patient  States that he has never has this issue before.  Patient describes the blood and specs and that The pain is a burning / irritation.  Currently he rates his pain 6 of 10.

## 2015-03-10 NOTE — ED Provider Notes (Signed)
CSN: [id number]     Arrival date & time 03/10/15  2119 History  By signing my name below, I, Tanda Rockers, attest that this documentation has been prepared under the direction and in the presence of Devoria Albe, MD at 0010. Electronically Signed: Tanda Rockers, ED Scribe. 03/10/2015. 12:24 AM.  Chief Complaint  Patient presents with  . Hematuria   The history is provided by the patient. No language interpreter was used.     HPI Comments: Adam Salazar is a 24 y.o. male who presents to the Emergency Department complaining of intermittent hematuria that began this morning. Pt reports that his urine was light red but denies seeing blood clots. Pt also noticed some blood in his underwear. He also complains of dysuria and increased urine output with pressure despite normal water intake. No recent injury, trauma, or fall. Denies frequency, urgency, difficulty urinating, penile drip, back pain, nausea, vomiting, abdominal pain, fever, chills, or any other associated symptoms. He has never had symptoms like this in the past. Denies drinking any red colored drinks. Pt is non smoker and non EtOH drinker. He denies any trauma. He denies taking any over-the-counter meds or any red fluids.  Patient does state he was treated for STDs about a month ago but this feels different.  PCP none  History reviewed. No pertinent past medical history. History reviewed. No pertinent past surgical history. History reviewed. No pertinent family history. Social History  Substance Use Topics  . Smoking status: Never Smoker   . Smokeless tobacco: None  . Alcohol Use: No   employed  Review of Systems  Constitutional: Negative for fever and chills.  Gastrointestinal: Negative for nausea, vomiting and abdominal pain.  Genitourinary: Positive for dysuria and hematuria. Negative for urgency, frequency, decreased urine volume, discharge, difficulty urinating and genital sores.  Musculoskeletal: Negative for back pain.   All other systems reviewed and are negative.  Allergies  Vicodin  Home Medications   Prior to Admission medications   Medication Sig Start Date End Date Taking? Authorizing Provider  doxycycline (VIBRAMYCIN) 100 MG capsule Take 1 capsule (100 mg total) by mouth 2 (two) times daily. Patient not taking: Reported on 03/10/2015 12/15/14   Elpidio Anis, PA-C  ibuprofen (ADVIL,MOTRIN) 800 MG tablet Take 1 tablet (800 mg total) by mouth 3 (three) times daily. Patient not taking: Reported on 09/23/2014 04/09/12   Ruby Cola, PA-C  ibuprofen (ADVIL,MOTRIN) 800 MG tablet Take 1 tablet (800 mg total) by mouth 3 (three) times daily. Patient not taking: Reported on 09/23/2014 05/22/14   Graylon Good, PA-C  traMADol (ULTRAM) 50 MG tablet Take 1 tablet (50 mg total) by mouth every 6 (six) hours as needed. Patient not taking: Reported on 12/15/2014 09/23/14   Gwyneth Sprout, MD   Triage VItals: BP 123/71 mmHg  Pulse 83  Temp(Src) 98.1 F (36.7 C) (Oral)  Resp 18  Ht  (1.702 m)  Wt 200 lb (90.719 kg)  BMI 31.32 kg/m2  SpO2 100%   Vital signs normal    Physical Exam  Constitutional: He is oriented to person, place, and time. He appears well-developed and well-nourished.  Non-toxic appearance. He does not appear ill. No distress.  HENT:  Head: Normocephalic and atraumatic.  Right Ear: External ear normal.  Left Ear: External ear normal.  Nose: Nose normal. No mucosal edema or rhinorrhea.  Mouth/Throat: Oropharynx is clear and moist and mucous membranes are normal. No dental abscesses or uvula swelling.  Eyes: Conjunctivae and EOM are normal. Pupils  are equal, round, and reactive to light.  Neck: Normal range of motion and full passive range of motion without pain. Neck supple.  Cardiovascular: Normal rate, regular rhythm and normal heart sounds.  Exam reveals no gallop and no friction rub.   No murmur heard. Pulmonary/Chest: Effort normal and breath sounds normal. No  respiratory distress. He has no wheezes. He has no rhonchi. He has no rales. He exhibits no tenderness and no crepitus.  Abdominal: Soft. Normal appearance and bowel sounds are normal. He exhibits no distension. There is no tenderness. There is no rebound, no guarding and no CVA tenderness.  No tenderness over the bladder  Genitourinary:  White material in urethra Underwear had faint red stains  Musculoskeletal: Normal range of motion. He exhibits no edema or tenderness.  Moves all extremities well.   Neurological: He is alert and oriented to person, place, and time. He has normal strength. No cranial nerve deficit.  Skin: Skin is warm, dry and intact. No rash noted. No erythema. No pallor.  Psychiatric: He has a normal mood and affect. His speech is normal and behavior is normal. His mood appears not anxious.  Nursing note and vitals reviewed.   ED Course  Procedures (including critical care time)  Medications  cefTRIAXone (ROCEPHIN) injection 250 mg (250 mg Intramuscular Given 03/11/15 0134)  azithromycin (ZITHROMAX) tablet 1,000 mg (1,000 mg Oral Given 03/11/15 0138)  ondansetron (ZOFRAN-ODT) disintegrating tablet 4 mg (4 mg Oral Given 03/11/15 0138)  lidocaine (XYLOCAINE) 2 % (with pres) injection (400 mg  Given 03/11/15 0135)     DIAGNOSTIC STUDIES: Oxygen Saturation is 100% on RA, normal by my interpretation.    COORDINATION OF CARE: 12:21 AM-Discussed treatment plan which includes bladder scan with pt at bedside and pt agreed to plan.   Bladder scan was 0  1:32 AM - Discussed negative Trichomonas. Will give pt injection of Rocephin and oral zithromax for presumed STD. Pt understands and agrees with this plan. He was informed he would be called if his STD test are positive.  Labs Review Results for orders placed or performed during the hospital encounter of 03/10/15  Urinalysis, Routine w reflex microscopic (not [facility])  Result Value Ref Range   Color, Urine YELLOW  YELLOW   APPearance TURBID (A) CLEAR   Specific Gravity, Urine 1.027 1.005 - 1.030   pH 7.5 5.0 - 8.0   Glucose, UA NEGATIVE NEGATIVE mg/dL   Hgb urine dipstick NEGATIVE NEGATIVE   Bilirubin Urine NEGATIVE NEGATIVE   Ketones, ur NEGATIVE NEGATIVE mg/dL   Protein, ur NEGATIVE NEGATIVE mg/dL   Urobilinogen, UA 1.0 0.0 - 1.0 mg/dL   Nitrite NEGATIVE NEGATIVE   Leukocytes, UA MODERATE (A) NEGATIVE  Urine microscopic-add on  Result Value Ref Range   Squamous Epithelial / LPF RARE RARE   WBC, UA 11-20 <3 WBC/hpf   Bacteria, UA RARE RARE   Urine-Other AMORPHOUS URATES/PHOSPHATES    Laboratory interpretation all normal except pyuria     Imaging Review No results found. I have personally reviewed and evaluated these lab results as part of my medical decision-making.   EKG Interpretation None      MDM   Final diagnoses:  Hematuria  Urethritis    Plan discharge  I personally performed the services described in this documentation, which was scribed in my presence. The recorded information has been reviewed and considered.  Devoria AlbeIva Josie Burleigh, MD, Concha PyoFACEP      Draven Laine, MD 03/11/15 [id number]

## 2015-03-10 NOTE — ED Notes (Signed)
Pt complains of blood in his urine and painful urination, he states that it feels like his bladder if full but has a hard time getting it started.

## 2015-03-11 LAB — GC/CHLAMYDIA PROBE AMP (~~LOC~~) NOT AT ARMC
Chlamydia: NEGATIVE
NEISSERIA GONORRHEA: NEGATIVE

## 2015-03-11 LAB — URINE MICROSCOPIC-ADD ON

## 2015-03-11 MED ORDER — LIDOCAINE HCL 2 % IJ SOLN
INTRAMUSCULAR | Status: AC
  Administered 2015-03-11: 400 mg
  Filled 2015-03-11: qty 20

## 2015-03-11 MED ORDER — AZITHROMYCIN 250 MG PO TABS
1000.0000 mg | ORAL_TABLET | Freq: Once | ORAL | Status: AC
  Administered 2015-03-11: 1000 mg via ORAL
  Filled 2015-03-11: qty 4

## 2015-03-11 MED ORDER — CEFTRIAXONE SODIUM 250 MG IJ SOLR
250.0000 mg | Freq: Once | INTRAMUSCULAR | Status: AC
  Administered 2015-03-11: 250 mg via INTRAMUSCULAR
  Filled 2015-03-11: qty 250

## 2015-03-11 MED ORDER — ONDANSETRON 4 MG PO TBDP
4.0000 mg | ORAL_TABLET | Freq: Once | ORAL | Status: AC
  Administered 2015-03-11: 4 mg via ORAL
  Filled 2015-03-11: qty 1

## 2015-03-11 NOTE — Discharge Instructions (Signed)
Drink plenty of fluids. You were treated tonight for urethritis. You will be called if your urine cultures grow something we didn't treat.    Urethritis, Adult Urethritis is an inflammation of the tube through which urine exits your bladder (urethra).  CAUSES Urethritis is often caused by an infection in your urethra. The infection can be viral, like herpes. The infection can also be bacterial, like gonorrhea. RISK FACTORS Risk factors of urethritis include:  Having sex without using a condom.  Having multiple sexual partners.  Having poor hygiene. SIGNS AND SYMPTOMS Symptoms of urethritis are less noticeable in women than in men. These symptoms include:  Burning feeling when you urinate (dysuria).  Discharge from your urethra.  Blood in your urine (hematuria).  Urinating more than usual. DIAGNOSIS  To confirm a diagnosis of urethritis, your health care provider will do the following:  Ask about your sexual history.  Perform a physical exam.  Have you provide a sample of your urine for lab testing.  Use a cotton swab to gently collect a sample from your urethra for lab testing. TREATMENT  It is important to treat urethritis. Depending on the cause, untreated urethritis may lead to serious genital infections and possibly infertility. Urethritis caused by a bacterial infection is treated with antibiotic medicine. All sexual partners must be treated.  HOME CARE INSTRUCTIONS  Do not have sex until the test results are known and treatment is completed, even if your symptoms go away before you finish treatment.  If you were prescribed an antibiotic, finish it all even if you start to feel better. SEEK MEDICAL CARE IF:   Your symptoms are not improved in 3 days.  Your symptoms are getting worse.  You develop abdominal pain or pelvic pain (in women).  You develop joint pain.  You have a fever. SEEK IMMEDIATE MEDICAL CARE IF:   You have severe pain in the belly, back, or  side.  You have repeated vomiting. MAKE SURE YOU:  Understand these instructions.  Will watch your condition.  Will get help right away if you are not doing well or get worse.   This information is not intended to replace advice given to you by your health care provider. Make sure you discuss any questions you have with your health care provider.   Document Released: 10/06/2000 Document Revised: 08/27/2014 Document Reviewed: 12/11/2012 Elsevier Interactive Patient Education [employer]2016 Elsevier Inc.

## 2015-03-12 LAB — URINE CULTURE
Culture: NO GROWTH
Special Requests: NORMAL

## 2015-03-15 ENCOUNTER — Ambulatory Visit (INDEPENDENT_AMBULATORY_CARE_PROVIDER_SITE_OTHER): Payer: Self-pay | Admitting: Family Medicine

## 2015-03-15 VITALS — BP 130/82 | HR 76 | Temp 98.9°F | Resp 18 | Ht 70.0 in | Wt 215.0 lb

## 2015-03-15 DIAGNOSIS — R319 Hematuria, unspecified: Secondary | ICD-10-CM

## 2015-03-15 LAB — POCT URINALYSIS DIP (MANUAL ENTRY)
BILIRUBIN UA: NEGATIVE
Bilirubin, UA: NEGATIVE
Glucose, UA: NEGATIVE
Leukocytes, UA: NEGATIVE
Nitrite, UA: NEGATIVE
PROTEIN UA: NEGATIVE
SPEC GRAV UA: 1.02
Urobilinogen, UA: 0.2
pH, UA: 7

## 2015-03-15 LAB — BASIC METABOLIC PANEL
BUN: 10 mg/dL (ref 7–25)
CHLORIDE: 103 mmol/L (ref 98–110)
CO2: 26 mmol/L (ref 20–31)
Calcium: 9.8 mg/dL (ref 8.6–10.3)
Creat: 1.27 mg/dL (ref 0.60–1.35)
Glucose, Bld: 85 mg/dL (ref 65–99)
POTASSIUM: 4.7 mmol/L (ref 3.5–5.3)
Sodium: 138 mmol/L (ref 135–146)

## 2015-03-15 LAB — POC MICROSCOPIC URINALYSIS (UMFC)

## 2015-03-15 NOTE — Progress Notes (Signed)
Patient discussed with Ms. Weber. Agree with assessment and plan of care per her note.   

## 2015-03-15 NOTE — Progress Notes (Signed)
Adam SimsRicky Salazar  MRN: [id number] DOB: 01/13/1991  Subjective:  Pt presents to clinic because he has blood in his urine.  He started with this 4 days ago and went to the ED and was treated with Rocephin and Zithromax but has not heard about his labs and is still having the problem.  He urinates and then after the stream is finished he has blood at the end dribble out.  It is clear and bloody.  He states that it is not painful prior to the blood but it is uncomfortable but not a burning more like a pressure sensation in his penis but not at the tip.  He has had no back pain, scrotal pain, penis pain.  No N/V/D. No F/C. He feels fine.  He is having no urinary urgency or frequency.  He has had no trauma to his penis.  No change in stream and no hesitancy.  He does not exercise.  Reviewed labs from ED - neg genprobe, neg urine culture  There are no active problems to display for this patient.   No current outpatient prescriptions on file prior to visit.   No current facility-administered medications on file prior to visit.    Allergies  Allergen Reactions  . Vicodin [Hydrocodone-Acetaminophen] Nausea Only    Review of Systems  Constitutional: Negative for fever and chills.  Genitourinary: Positive for dysuria and hematuria. Negative for urgency, frequency, discharge, penile swelling, scrotal swelling and difficulty urinating.   Objective:  BP 130/82 mmHg  Pulse 76  Temp(Src) 98.9 F (37.2 C) (Oral)  Resp 18  Ht 5\' 10"  (1.778 m)  Wt 215 lb (97.523 kg)  BMI 30.85 kg/m2  SpO2 98%  Physical Exam  Constitutional: He is oriented to person, place, and time and well-developed, well-nourished, and in no distress.  HENT:  Head: Normocephalic and atraumatic.  Right Ear: External ear normal.  Left Ear: External ear normal.  Eyes: Conjunctivae are normal.  Neck: Normal range of motion.  Cardiovascular: Normal rate, regular rhythm and normal heart sounds.   No murmur heard. Pulmonary/Chest:  Effort normal and breath sounds normal.  Abdominal: There is no tenderness. There is no CVA tenderness.  Genitourinary: Penis exhibits no lesions. Clear  bloody and discharge found.  Neurological: He is alert and oriented to person, place, and time. Gait normal.  Skin: Skin is warm and dry.  Psychiatric: Mood, memory, affect and judgment normal.   Results for orders placed or performed in visit on 03/15/15  POCT urinalysis dipstick  Result Value Ref Range   Color, UA yellow yellow   Clarity, UA clear clear   Glucose, UA negative negative   Bilirubin, UA negative negative   Ketones, POC UA negative negative   Spec Grav, UA 1.020    Blood, UA trace-intact (A) negative   pH, UA 7.0    Protein Ur, POC negative negative   Urobilinogen, UA 0.2    Nitrite, UA Negative Negative   Leukocytes, UA Negative Negative  POCT Microscopic Urinalysis ([facility])  Result Value Ref Range   WBC,UR,HPF,POC None None WBC/hpf   RBC,UR,HPF,POC Few (A) None RBC/hpf   Bacteria Few (A) None, Too numerous to count   Mucus Present (A) Absent   Epithelial Cells, UR Per Microscopy Few (A) None, Too numerous to count cells/hpf    Assessment and Plan :  Hematuria - Plan: POCT urinalysis dipstick, POCT Microscopic Urinalysis ([facility]), Basic metabolic panel  Unsure of cause at this time.  We will wait for labs  and watch over the next week to see if the hematuria resolves.  If no resolution will send to urology for further evaluation.  He is to rest over the next week, no sex or exercise.  D/w Dr Carman Ching PA-C  [facility] 03/15/2015 1:54 PM

## 2015-03-17 LAB — PSA: PSA: 0.68 ng/mL (ref <=4.00)

## 2015-03-18 ENCOUNTER — Encounter: Payer: Self-pay | Admitting: Family Medicine

## 2015-09-23 ENCOUNTER — Encounter (HOSPITAL_COMMUNITY): Payer: Self-pay | Admitting: Emergency Medicine

## 2015-09-23 ENCOUNTER — Ambulatory Visit (HOSPITAL_COMMUNITY)
Admission: EM | Admit: 2015-09-23 | Discharge: 2015-09-23 | Disposition: A | Discharge status: Home / Self Care | Payer: Self-pay | Attending: Family Medicine | Admitting: Family Medicine

## 2015-09-23 DIAGNOSIS — Z202 Contact with and (suspected) exposure to infections with a predominantly sexual mode of transmission: Secondary | ICD-10-CM | POA: Insufficient documentation

## 2015-09-23 DIAGNOSIS — Z711 Person with feared health complaint in whom no diagnosis is made: Secondary | ICD-10-CM

## 2015-09-23 MED ORDER — METRONIDAZOLE 500 MG PO TABS: 500.0000 mg | ORAL_TABLET | Freq: Two times a day (BID) | ORAL | Status: DC

## 2015-09-23 NOTE — ED Provider Notes (Signed)
CSN: [id number]     Arrival date & time 09/23/15  1312 History   None    Chief Complaint  Patient presents with  . Exposure to STD   (Consider location/radiation/quality/duration/timing/severity/associated sxs/prior Treatment) Patient is a 25 y.o. male presenting with STD exposure. The history is provided by the patient.  Exposure to STD This is a new problem. The current episode started more than 2 days ago (wife just had child and found yest to have trich, pt denies sx.Marland Kitchen.). The problem has not changed since onset.Pertinent negatives include no abdominal pain.    History reviewed. No pertinent past medical history. History reviewed. No pertinent past surgical history. History reviewed. No pertinent family history. Social History  Substance Use Topics  . Smoking status: Never Smoker   . Smokeless tobacco: None  . Alcohol Use: No    Review of Systems  Constitutional: Negative.   Gastrointestinal: Negative for abdominal pain.  Genitourinary: Negative.  Negative for discharge, penile swelling, scrotal swelling, penile pain and testicular pain.  All other systems reviewed and are negative.   Allergies  Vicodin  Home Medications   Prior to Admission medications   Medication Sig Start Date End Date Taking? Authorizing Provider  metroNIDAZOLE (FLAGYL) 500 MG tablet Take 1 tablet (500 mg total) by mouth 2 (two) times daily. 09/23/15   Linna HoffJames D Kindl, MD   Meds Ordered and Administered this Visit  Medications - No data to display  BP 142/95 mmHg  Pulse 79  Temp(Src) 99.4 F (37.4 C) (Oral)  Resp 18  SpO2 99% No data found.   Physical Exam  Constitutional: He is oriented to person, place, and time. He appears well-developed and well-nourished.  Abdominal: Soft. Bowel sounds are normal. There is no tenderness.  Genitourinary: Penis normal. No penile tenderness.  Neurological: He is alert and oriented to person, place, and time.  Skin: Skin is warm and dry.  Nursing note and  vitals reviewed.   ED Course  Procedures (including critical care time)  Labs Review Labs Reviewed  URINE CYTOLOGY ANCILLARY ONLY    Imaging Review No results found.   Visual Acuity Review  Right Eye Distance:   Left Eye Distance:   Bilateral Distance:    Right Eye Near:   Left Eye Near:    Bilateral Near:         MDM   1. Concern about STD in male without diagnosis        Linna HoffJames D Kindl, MD 09/23/15 [id number]
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
## 2015-09-24 LAB — URINE CYTOLOGY ANCILLARY ONLY
CHLAMYDIA, DNA PROBE: NEGATIVE
NEISSERIA GONORRHEA: NEGATIVE
TRICH (WINDOWPATH): NEGATIVE

## 2016-01-27 ENCOUNTER — Encounter (HOSPITAL_COMMUNITY): Payer: Self-pay | Admitting: Emergency Medicine

## 2016-01-27 ENCOUNTER — Emergency Department (HOSPITAL_COMMUNITY): Payer: Self-pay

## 2016-01-27 DIAGNOSIS — F1290 Cannabis use, unspecified, uncomplicated: Secondary | ICD-10-CM | POA: Insufficient documentation

## 2016-01-27 DIAGNOSIS — R112 Nausea with vomiting, unspecified: Secondary | ICD-10-CM | POA: Insufficient documentation

## 2016-01-27 DIAGNOSIS — B349 Viral infection, unspecified: Secondary | ICD-10-CM | POA: Insufficient documentation

## 2016-01-27 MED ORDER — ALBUTEROL SULFATE (2.5 MG/3ML) 0.083% IN NEBU
5.0000 mg | INHALATION_SOLUTION | Freq: Once | RESPIRATORY_TRACT | Status: AC
  Administered 2016-01-28: 5 mg via RESPIRATORY_TRACT
  Filled 2016-01-27: qty 6
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
## 2016-01-28 ENCOUNTER — Emergency Department (HOSPITAL_COMMUNITY)
Admission: EM | Admit: 2016-01-28 | Discharge: 2016-01-28 | Disposition: A | Discharge status: Home / Self Care | Payer: Self-pay | Attending: Emergency Medicine | Admitting: Emergency Medicine

## 2016-01-28 DIAGNOSIS — B349 Viral infection, unspecified: Secondary | ICD-10-CM

## 2016-01-28 LAB — I-STAT CHEM 8, ED
BUN: 7 mg/dL (ref 6–20)
CALCIUM ION: 1.1 mmol/L — AB (ref 1.15–1.40)
CREATININE: 1.3 mg/dL — AB (ref 0.61–1.24)
Chloride: 100 mmol/L — ABNORMAL LOW (ref 101–111)
GLUCOSE: 119 mg/dL — AB (ref 65–99)
HCT: 45 % (ref 39.0–52.0)
HEMOGLOBIN: 15.3 g/dL (ref 13.0–17.0)
POTASSIUM: 3.5 mmol/L (ref 3.5–5.1)
Sodium: 139 mmol/L (ref 135–145)
TCO2: 27 mmol/L (ref 0–100)

## 2016-01-28 LAB — URINALYSIS, ROUTINE W REFLEX MICROSCOPIC
Bilirubin Urine: NEGATIVE
GLUCOSE, UA: NEGATIVE mg/dL
HGB URINE DIPSTICK: NEGATIVE
KETONES UR: NEGATIVE mg/dL
LEUKOCYTES UA: NEGATIVE
Nitrite: NEGATIVE
PH: 6 (ref 5.0–8.0)
Protein, ur: NEGATIVE mg/dL
Specific Gravity, Urine: 1.027 (ref 1.005–1.030)

## 2016-01-28 LAB — CBC WITH DIFFERENTIAL/PLATELET
BASOS PCT: 3 %
Basophils Absolute: 0.3 10*3/uL — ABNORMAL HIGH (ref 0.0–0.1)
Eosinophils Absolute: 0.1 10*3/uL (ref 0.0–0.7)
Eosinophils Relative: 1 %
HEMATOCRIT: 42.2 % (ref 39.0–52.0)
Hemoglobin: 14.4 g/dL (ref 13.0–17.0)
LYMPHS PCT: 60 %
Lymphs Abs: 6.8 10*3/uL — ABNORMAL HIGH (ref 0.7–4.0)
MCH: 31.9 pg (ref 26.0–34.0)
MCHC: 34.1 g/dL (ref 30.0–36.0)
MCV: 93.6 fL (ref 78.0–100.0)
Monocytes Absolute: 1.3 10*3/uL — ABNORMAL HIGH (ref 0.1–1.0)
Monocytes Relative: 12 %
NEUTROS PCT: 24 %
Neutro Abs: 2.7 10*3/uL (ref 1.7–7.7)
PLATELETS: 219 10*3/uL (ref 150–400)
RBC: 4.51 MIL/uL (ref 4.22–5.81)
RDW: 12.4 % (ref 11.5–15.5)
WBC: 11.2 10*3/uL — ABNORMAL HIGH (ref 4.0–10.5)

## 2016-01-28 LAB — PATHOLOGIST SMEAR REVIEW

## 2016-01-28 LAB — RAPID STREP SCREEN (MED CTR MEBANE ONLY): STREPTOCOCCUS, GROUP A SCREEN (DIRECT): NEGATIVE

## 2016-01-28 MED ORDER — SODIUM CHLORIDE 0.9 % IV BOLUS (SEPSIS)
1000.0000 mL | Freq: Once | INTRAVENOUS | Status: AC
  Administered 2016-01-28: 1000 mL via INTRAVENOUS

## 2016-01-28 MED ORDER — ONDANSETRON HCL 4 MG/2ML IJ SOLN
4.0000 mg | Freq: Once | INTRAMUSCULAR | Status: DC
  Filled 2016-01-28: qty 2

## 2016-01-28 NOTE — Discharge Instructions (Signed)
Tonight your were treated for a viral illness. You can continue takinTylenol or Motrin for discomfort, fever Drink plenty of fluids Get plenty of rest

## 2016-01-28 NOTE — ED Notes (Signed)
MD at bedside. 
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
## 2016-01-28 NOTE — ED Notes (Signed)
Patient denies pain and is resting comfortably.  

## 2016-01-30 LAB — CULTURE, GROUP A STREP (THRC)

## 2019-01-14 ENCOUNTER — Emergency Department (HOSPITAL_COMMUNITY): Payer: Self-pay

## 2019-01-14 ENCOUNTER — Encounter (HOSPITAL_COMMUNITY): Payer: Self-pay | Admitting: Emergency Medicine

## 2019-01-14 ENCOUNTER — Emergency Department (HOSPITAL_COMMUNITY)
Admission: EM | Admit: 2019-01-14 | Discharge: 2019-01-14 | Disposition: A | Discharge status: Home / Self Care | Payer: Self-pay | Attending: Emergency Medicine | Admitting: Emergency Medicine

## 2019-01-14 DIAGNOSIS — S0101XA Laceration without foreign body of scalp, initial encounter: Secondary | ICD-10-CM | POA: Insufficient documentation

## 2019-01-14 DIAGNOSIS — W3400XA Accidental discharge from unspecified firearms or gun, initial encounter: Secondary | ICD-10-CM

## 2019-01-14 DIAGNOSIS — S55802A Unspecified injury of other blood vessels at forearm level, left arm, initial encounter: Secondary | ICD-10-CM

## 2019-01-14 DIAGNOSIS — S70211A Abrasion, right hip, initial encounter: Secondary | ICD-10-CM | POA: Insufficient documentation

## 2019-01-14 DIAGNOSIS — Z23 Encounter for immunization: Secondary | ICD-10-CM | POA: Insufficient documentation

## 2019-01-14 DIAGNOSIS — S51802A Unspecified open wound of left forearm, initial encounter: Secondary | ICD-10-CM | POA: Insufficient documentation

## 2019-01-14 DIAGNOSIS — Y939 Activity, unspecified: Secondary | ICD-10-CM | POA: Insufficient documentation

## 2019-01-14 DIAGNOSIS — S41002A Unspecified open wound of left shoulder, initial encounter: Secondary | ICD-10-CM | POA: Insufficient documentation

## 2019-01-14 DIAGNOSIS — Y999 Unspecified external cause status: Secondary | ICD-10-CM | POA: Insufficient documentation

## 2019-01-14 DIAGNOSIS — Y929 Unspecified place or not applicable: Secondary | ICD-10-CM | POA: Insufficient documentation

## 2019-01-14 LAB — COMPREHENSIVE METABOLIC PANEL
ALT: 32 U/L (ref 0–44)
AST: 38 U/L (ref 15–41)
Albumin: 4.4 g/dL (ref 3.5–5.0)
Alkaline Phosphatase: 60 U/L (ref 38–126)
Anion gap: 13 (ref 5–15)
BUN: 6 mg/dL (ref 6–20)
CO2: 22 mmol/L (ref 22–32)
Calcium: 9.3 mg/dL (ref 8.9–10.3)
Chloride: 106 mmol/L (ref 98–111)
Creatinine, Ser: 1.71 mg/dL — ABNORMAL HIGH (ref 0.61–1.24)
GFR calc Af Amer: >60 mL/min (ref >60)
GFR calc non Af Amer: 53 mL/min — ABNORMAL LOW (ref >60)
Glucose, Bld: 173 mg/dL — ABNORMAL HIGH (ref 70–99)
Potassium: 3.6 mmol/L (ref 3.5–5.1)
Sodium: 141 mmol/L (ref 135–145)
Total Bilirubin: 0.6 mg/dL (ref 0.3–1.2)
Total Protein: 7.6 g/dL (ref 6.5–8.1)

## 2019-01-14 LAB — CBC
HCT: 44.8 % (ref 39.0–52.0)
Hemoglobin: 15 g/dL (ref 13.0–17.0)
MCH: 33.2 pg (ref 26.0–34.0)
MCHC: 33.5 g/dL (ref 30.0–36.0)
MCV: 99.1 fL (ref 80.0–100.0)
Platelets: 272 10*3/uL (ref 150–400)
RBC: 4.52 MIL/uL (ref 4.22–5.81)
RDW: 12.2 % (ref 11.5–15.5)
WBC: 12.1 10*3/uL — ABNORMAL HIGH (ref 4.0–10.5)
nRBC: 0 % (ref 0.0–0.2)

## 2019-01-14 LAB — LACTIC ACID, PLASMA
Lactic Acid, Venous: 6 mmol/L (ref 0.5–1.9)
Lactic Acid, Venous: 2.1 mmol/L (ref 0.5–1.9)

## 2019-01-14 MED ORDER — SODIUM CHLORIDE 0.9 % IV BOLUS
1000.0000 mL | Freq: Once | INTRAVENOUS | Status: AC
  Administered 2019-01-14: 1000 mL via INTRAVENOUS

## 2019-01-14 MED ORDER — HYDROMORPHONE HCL 1 MG/ML IJ SOLN
0.5000 mg | Freq: Once | INTRAMUSCULAR | Status: AC
  Administered 2019-01-14: 0.5 mg via INTRAVENOUS
  Filled 2019-01-14: qty 1

## 2019-01-14 MED ORDER — OXYCODONE-ACETAMINOPHEN 5-325 MG PO TABS: 1.0000 | ORAL_TABLET | ORAL | 0 refills | Status: DC | PRN

## 2019-01-14 MED ORDER — LIDOCAINE HCL 2 % IJ SOLN
20.0000 mL | Freq: Once | INTRAMUSCULAR | Status: DC
  Filled 2019-01-14: qty 20

## 2019-01-14 MED ORDER — SODIUM CHLORIDE 0.9 % IV SOLN
INTRAVENOUS | Status: AC | PRN
  Administered 2019-01-14: 1000 mL via INTRAVENOUS

## 2019-01-14 MED ORDER — HYDROMORPHONE HCL 1 MG/ML IJ SOLN
INTRAMUSCULAR | Status: AC
  Filled 2019-01-14: qty 1

## 2019-01-14 MED ORDER — TETANUS-DIPHTH-ACELL PERTUSSIS 5-2.5-18.5 LF-MCG/0.5 IM SUSP
0.5000 mL | Freq: Once | INTRAMUSCULAR | Status: AC
  Administered 2019-01-14: 0.5 mL via INTRAMUSCULAR
  Filled 2019-01-14: qty 0.5

## 2019-01-14 MED ORDER — HYDROMORPHONE HCL 1 MG/ML IJ SOLN
INTRAMUSCULAR | Status: AC | PRN
  Administered 2019-01-14: 0.5 mg via INTRAVENOUS

## 2019-01-14 MED ORDER — IOHEXOL 350 MG/ML SOLN
100.0000 mL | Freq: Once | INTRAVENOUS | Status: AC | PRN
  Administered 2019-01-14: 100 mL via INTRAVENOUS
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
## 2019-01-14 NOTE — ED Notes (Signed)
Dr fields at bedside.
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
## 2019-01-15 ENCOUNTER — Encounter (HOSPITAL_COMMUNITY): Payer: Self-pay | Admitting: Emergency Medicine

## 2019-03-03 ENCOUNTER — Emergency Department (HOSPITAL_COMMUNITY)
Admission: EM | Admit: 2019-03-03 | Discharge: 2019-03-04 | Disposition: A | Discharge status: Home / Self Care | Payer: Self-pay | Attending: Emergency Medicine | Admitting: Emergency Medicine

## 2019-03-03 ENCOUNTER — Other Ambulatory Visit: Payer: Self-pay

## 2019-03-03 DIAGNOSIS — S42352A Displaced comminuted fracture of shaft of humerus, left arm, initial encounter for closed fracture: Secondary | ICD-10-CM

## 2019-03-03 DIAGNOSIS — Y9389 Activity, other specified: Secondary | ICD-10-CM | POA: Insufficient documentation

## 2019-03-03 DIAGNOSIS — Y999 Unspecified external cause status: Secondary | ICD-10-CM | POA: Insufficient documentation

## 2019-03-03 DIAGNOSIS — Y929 Unspecified place or not applicable: Secondary | ICD-10-CM | POA: Insufficient documentation

## 2019-03-03 DIAGNOSIS — X503XXA Overexertion from repetitive movements, initial encounter: Secondary | ICD-10-CM | POA: Insufficient documentation

## 2019-03-03 NOTE — ED Triage Notes (Signed)
Patient states that he was throwing a ball and dislocated his left shoulder.

## 2019-03-04 ENCOUNTER — Emergency Department (HOSPITAL_COMMUNITY): Payer: Self-pay

## 2019-03-04 MED ORDER — IBUPROFEN 800 MG PO TABS: 800.0000 mg | ORAL_TABLET | Freq: Once | ORAL | Status: DC

## 2019-03-04 MED ORDER — OXYCODONE-ACETAMINOPHEN 5-325 MG PO TABS
2.0000 | ORAL_TABLET | Freq: Once | ORAL | Status: AC
  Administered 2019-03-04: 01:00:00 2 via ORAL
  Filled 2019-03-04: qty 2

## 2019-03-04 MED ORDER — ONDANSETRON 4 MG PO TBDP
4.0000 mg | ORAL_TABLET | Freq: Once | ORAL | Status: AC
  Administered 2019-03-04: 4 mg via ORAL
  Filled 2019-03-04: qty 1

## 2019-03-04 MED ORDER — OXYCODONE-ACETAMINOPHEN 5-325 MG PO TABS: 2.0000 | ORAL_TABLET | Freq: Four times a day (QID) | ORAL | 0 refills | Status: DC | PRN

## 2019-03-04 MED ORDER — ONDANSETRON HCL 4 MG PO TABS: 4.0000 mg | ORAL_TABLET | Freq: Four times a day (QID) | ORAL | 0 refills | Status: AC | PRN

## 2019-03-04 NOTE — Progress Notes (Signed)
Orthopedic Tech Progress Note Patient Details:  Adam Salazar [birth year] [id number]  Ortho Devices Type of Ortho Device: Sling immobilizer, Coapt Ortho Device/Splint Location: lue Ortho Device/Splint Interventions: Ordered, Application, Adjustment   Post Interventions Patient Tolerated: Well Instructions Provided: Care of device, Adjustment of device   Karolee Stamps 03/04/2019, 2:47 AM
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
## 2019-03-04 NOTE — ED Notes (Signed)
Ortho tech called 

## 2019-03-04 NOTE — Discharge Instructions (Addendum)
Please follow-up with orthopedics as an outpatient.  You should also establish care with a primary care physician.  List of primary care physicians as listed below.   Steps to find a Primary Care Provider (PCP):  Call [phone] or [phone] to access "[place] [facility] Doctor Service."  2.  You may also go on the [facility] website at [email or web address]  3.  [facility] also frequently accepts new patients.  [place] [phone]  4.  There are also multiple [facility], [facility] and [facility] practices throughout the [place] that are frequently accepting new patients. You may find a clinic that is close to your home and contact them.  [email or web address] [phone]  [facility] [email or web address]  [email or web address] [email or web address] [phone]  5.  Local Health Departments also can provide primary care services.  [facility]  [place] [address] [phone]  [facility] [place] [address] [place] [facility] [place] [phone]

## 2019-03-07 ENCOUNTER — Encounter: Payer: Self-pay | Admitting: Orthopedic Surgery

## 2019-03-07 ENCOUNTER — Ambulatory Visit: Payer: Self-pay | Admitting: Orthopedic Surgery

## 2019-03-07 ENCOUNTER — Other Ambulatory Visit: Payer: Self-pay

## 2019-03-07 DIAGNOSIS — S42392A Other fracture of shaft of left humerus, initial encounter for closed fracture: Secondary | ICD-10-CM

## 2019-03-07 MED ORDER — OXYCODONE-ACETAMINOPHEN 5-325 MG PO TABS: 1.0000 | ORAL_TABLET | Freq: Four times a day (QID) | ORAL | 0 refills | Status: DC | PRN

## 2019-03-08 ENCOUNTER — Encounter: Payer: Self-pay | Admitting: Orthopedic Surgery
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
## 2019-03-28 ENCOUNTER — Ambulatory Visit: Payer: Self-pay

## 2019-03-28 ENCOUNTER — Other Ambulatory Visit: Payer: Self-pay

## 2019-03-28 ENCOUNTER — Encounter: Payer: Self-pay | Admitting: Orthopedic Surgery

## 2019-03-28 ENCOUNTER — Ambulatory Visit (INDEPENDENT_AMBULATORY_CARE_PROVIDER_SITE_OTHER): Payer: Self-pay | Admitting: Orthopedic Surgery

## 2019-03-28 DIAGNOSIS — S42392A Other fracture of shaft of left humerus, initial encounter for closed fracture: Secondary | ICD-10-CM

## 2019-03-28 MED ORDER — OXYCODONE-ACETAMINOPHEN 5-325 MG PO TABS: 1.0000 | ORAL_TABLET | Freq: Four times a day (QID) | ORAL | 0 refills | Status: AC | PRN

## 2019-03-29 ENCOUNTER — Encounter: Payer: Self-pay | Admitting: Orthopedic Surgery

## 2019-03-29 NOTE — Progress Notes (Signed)
   Post-Op Visit Note   Patient: Adam Salazar           Date of Birth: 10/04/1990           MRN: [id number] Visit Date: 03/28/2019 PCP: Patient, No Pcp Per   Assessment & Plan:  Chief Complaint:  Chief Complaint  Patient presents with  . Left Arm - Follow-up   Visit Diagnoses:  1. Other closed fracture of shaft of left humerus, initial encounter     Plan: Sayf is in about 2 weeks out left proximal humerus fracture.  He has been doing reasonably well.  He has been in a Sarmiento brace.  On exam he has good EPL and wrist extensor function.  Radial pulses intact.  Fracture is moving as a unit.  New stockinettes applied today.  Radiographs look good.  Continue with Sarmiento brace for 2 more weeks and then we will change him over to a sling with pendulum range of motion exercises.  I do not want him doing any lifting or really anything else other than being in the brace at this time.  Follow-Up Instructions: No follow-ups on file.   Orders:  Orders Placed This Encounter  Procedures  . XR Humerus Left   Meds ordered this encounter  Medications  . oxyCODONE-acetaminophen (PERCOCET/ROXICET) 5-325 MG tablet    Sig: Take 1 tablet by mouth every 6 (six) hours as needed for severe pain.    Dispense:  35 tablet    Refill:  0    Imaging: No results found.  PMFS History: There are no active problems to display for this patient.  History reviewed. No pertinent past medical history.  History reviewed. No pertinent family history.  History reviewed. No pertinent surgical history. Social History   Occupational History  . Not on file  Tobacco Use  . Smoking status: Never Smoker  . Smokeless tobacco: Never Used  Substance and Sexual Activity  . Alcohol use: Yes    Comment: social   . Drug use: Never    Types: Marijuana  . Sexual activity: Not on file

## 2019-04-12 ENCOUNTER — Ambulatory Visit: Payer: Self-pay | Admitting: Orthopedic Surgery
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
## 2019-12-03 IMAGING — DX DG SHOULDER 2+V*L*
1 series · 1 of 1 positions shown · non-contrast
Comparison: None.

CLINICAL DATA: Initial evaluation for acute injury.

EXAM:
LEFT SHOULDER - 2+ VIEW

[shoulder axial]
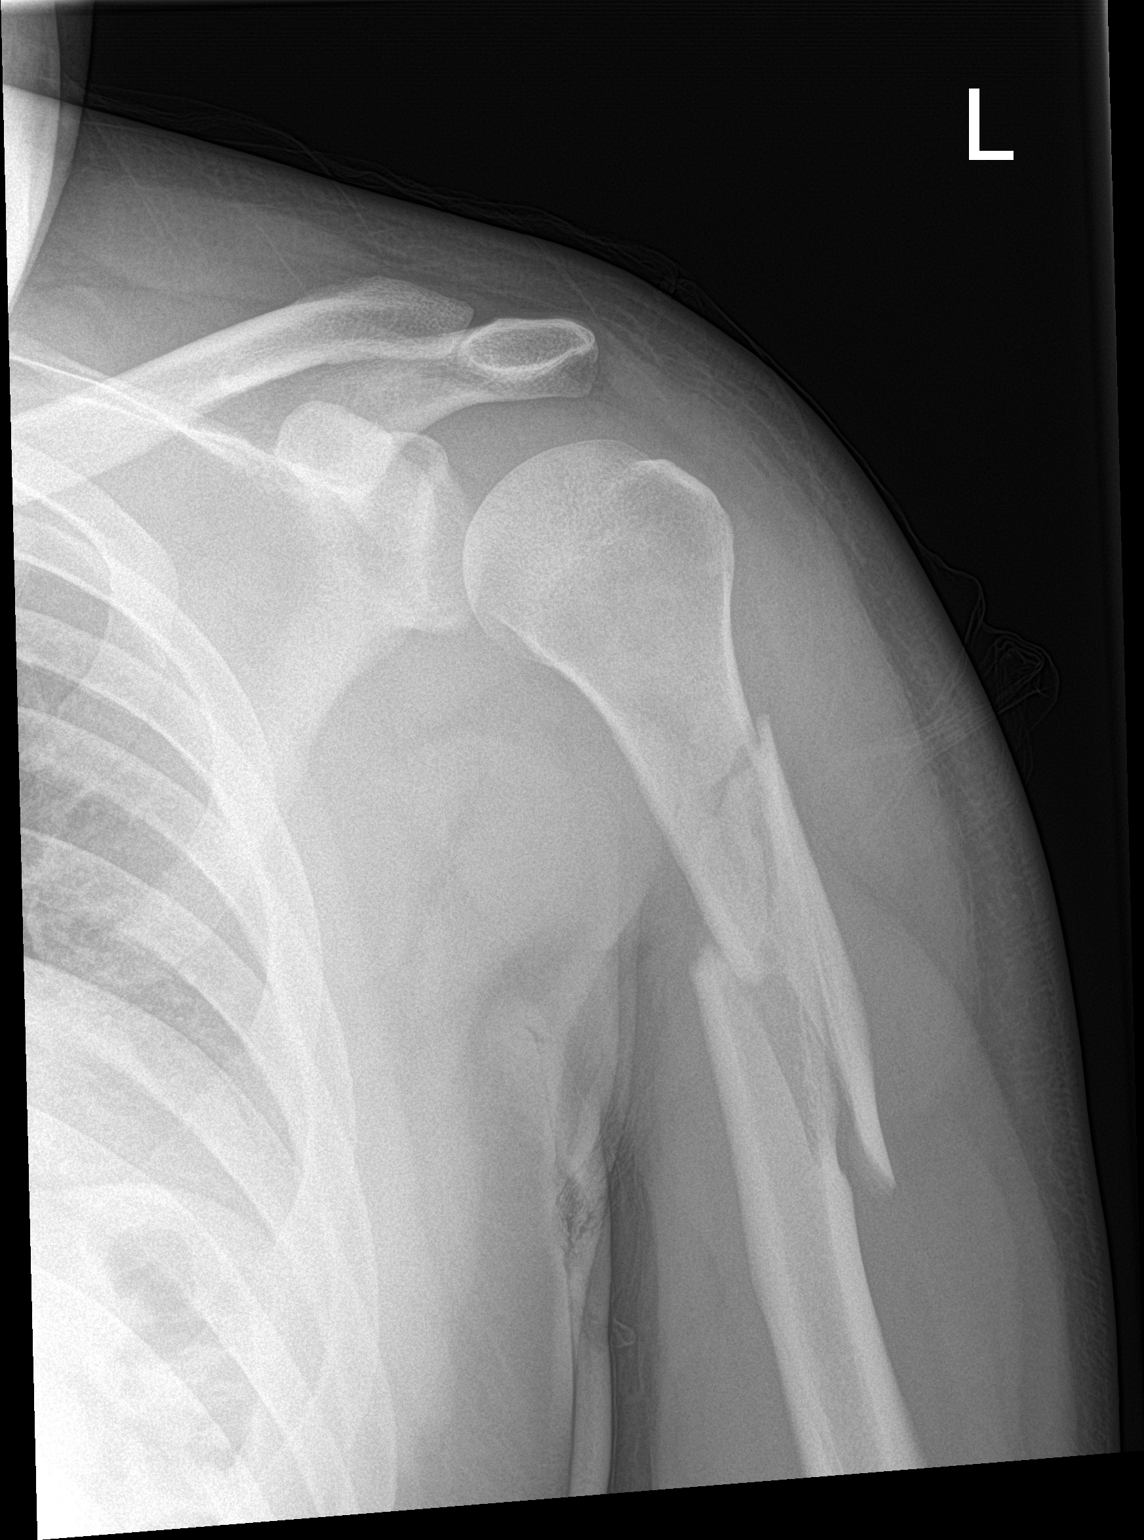

[1 of 1 positions shown; findings below may reference images not displayed]

FINDINGS: Acute comminuted fracture of the proximal left humeral shaft. Mild
medial displacement of the main distal fracture fragment. Slight
lateral displacement of a butterfly fragment. Humeral head remains
in normal alignment with the glenoid. AC joint approximated. Soft
tissue swelling present about the proximal arm.
IMPRESSION: 1. Acute comminuted and mildly displaced fracture of the proximal
left humeral shaft.
2. No other acute traumatic injury about the shoulder. No
dislocation.

## 2019-12-03 IMAGING — DX DG HUMERUS 2V *L*
2 series · 2 of 2 positions shown · non-contrast
Comparison: None.

CLINICAL DATA: Initial evaluation for acute pain.

EXAM:
LEFT HUMERUS - 2+ VIEW

[humerus ap]
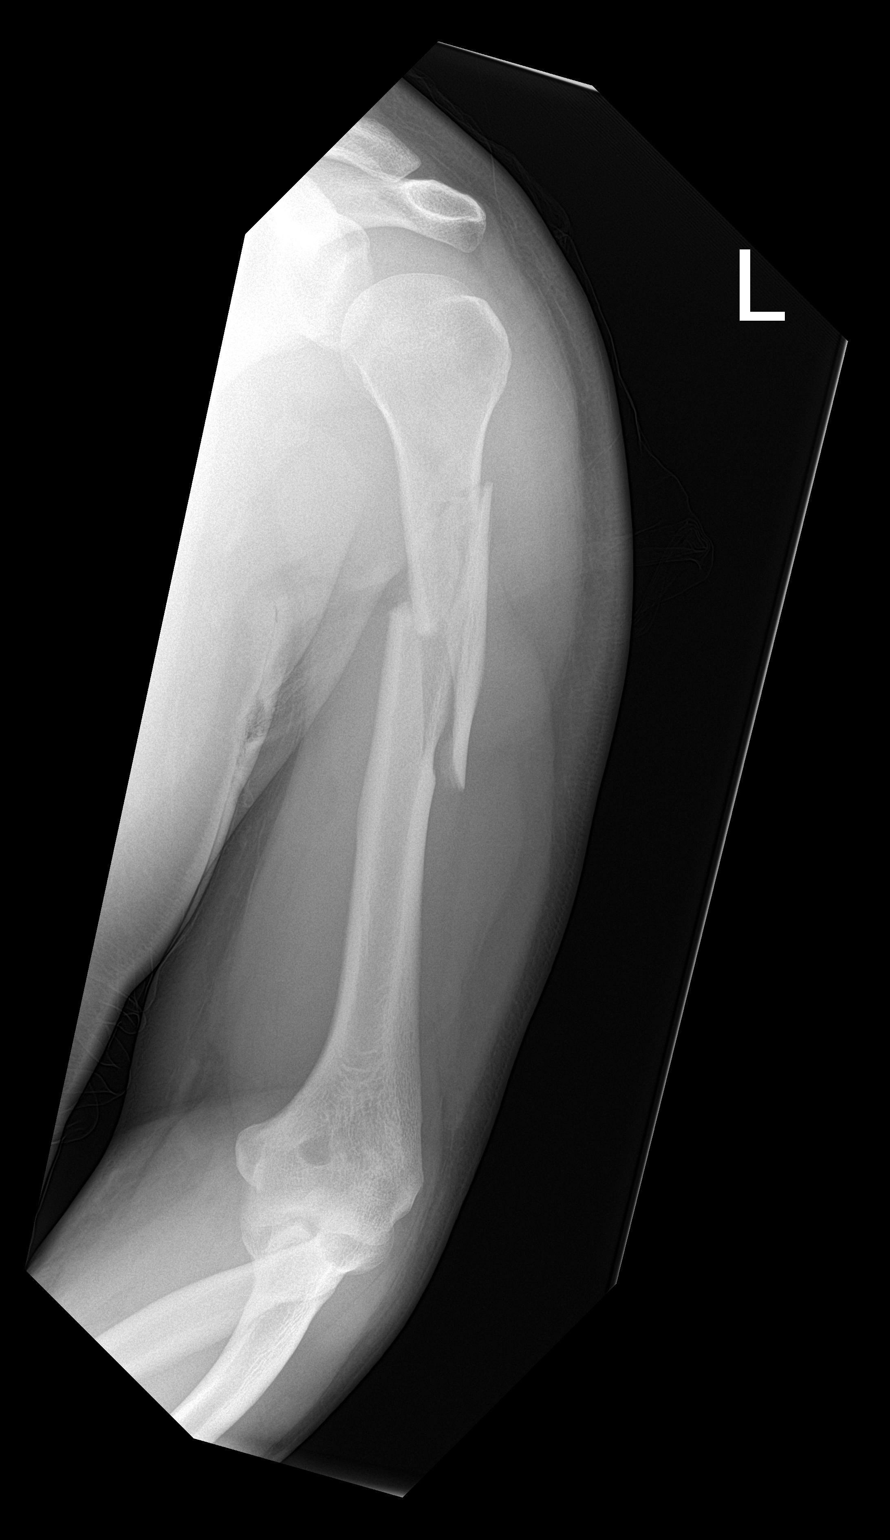

[humerus lat]
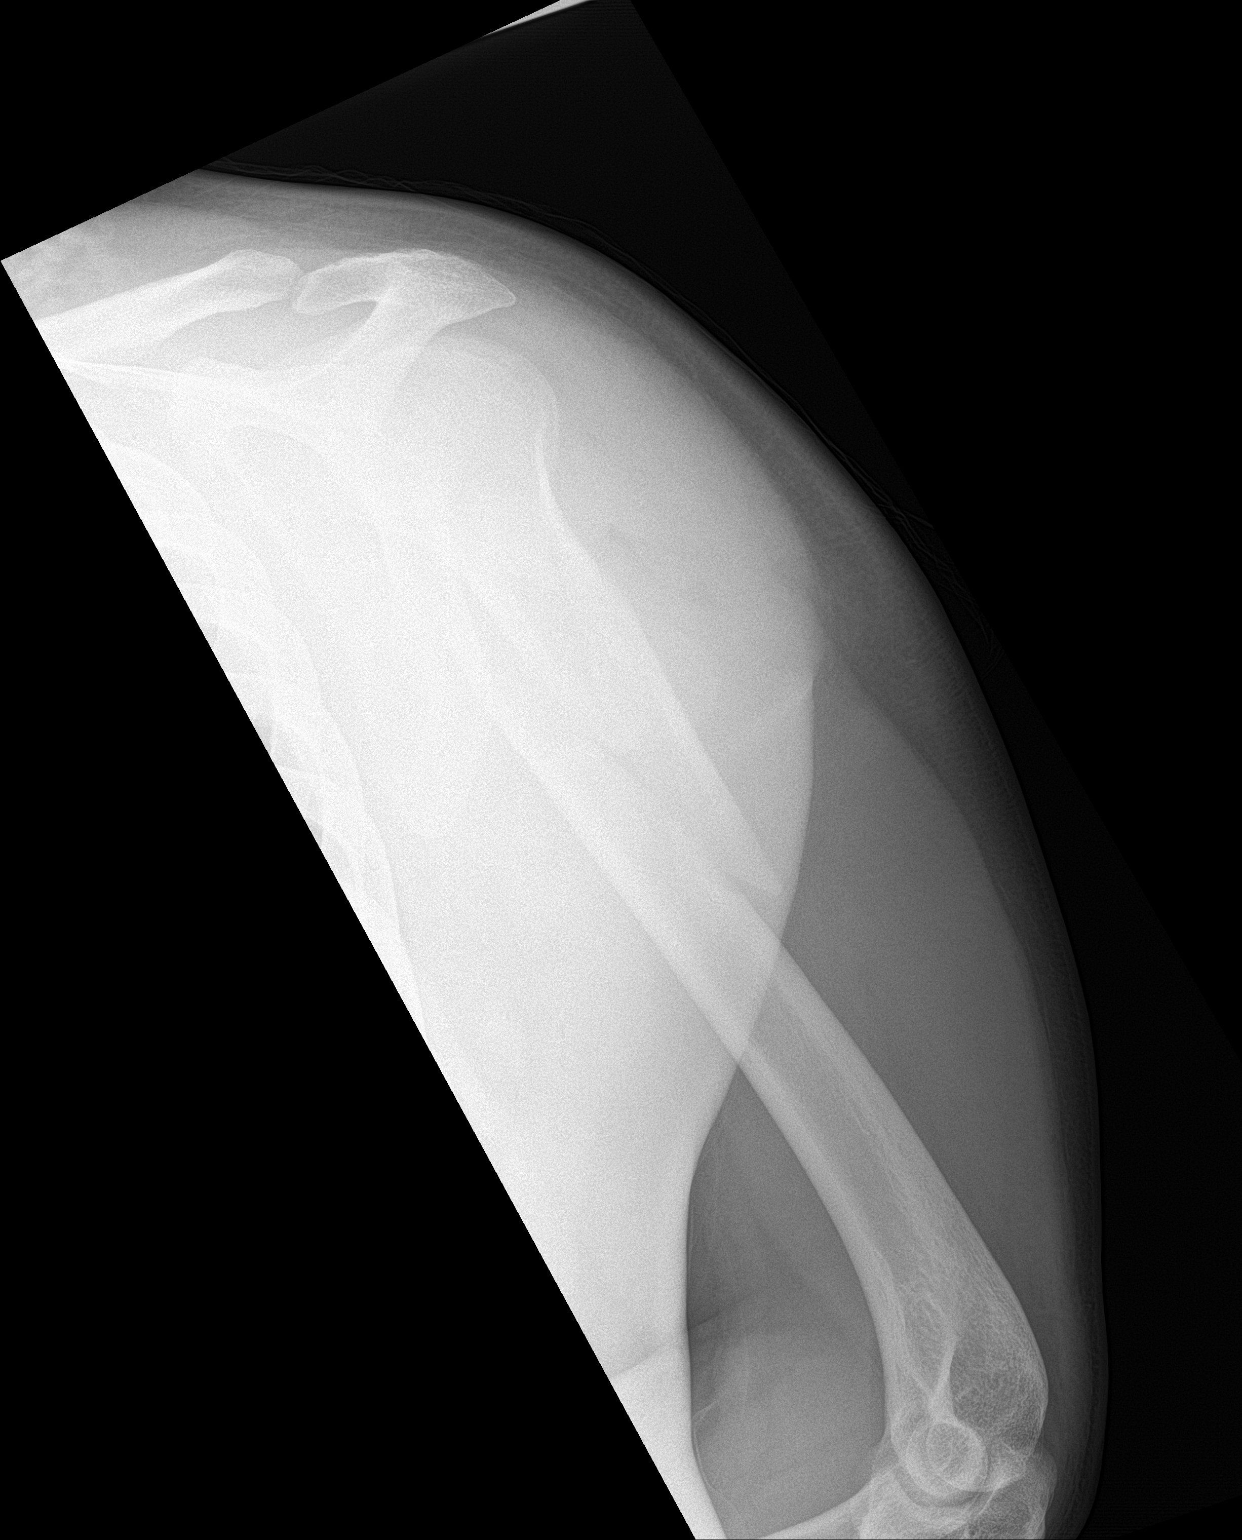

[2 of 2 positions shown; findings below may reference images not displayed]

FINDINGS: Acute comminuted fracture of the proximal left humeral shaft with
anterior and medial displacement of the main distal fracture
fragment. Slight lateral displacement of a prominent butterfly
fragment. Overlying soft tissue swelling.
IMPRESSION: Acute mildly displaced comminuted fracture of the proximal left
humeral shaft.

## 2023-07-28 ENCOUNTER — Other Ambulatory Visit: Payer: Self-pay | Admitting: Orthopedic Surgery

## 2023-07-28 NOTE — Telephone Encounter (Signed)
 Needs appointment if having continued pain. Never followed up after last visit

## 2024-02-27 ENCOUNTER — Encounter: Payer: Self-pay | Admitting: Radiology
